# Patient Record
Sex: Male | Born: 1975 | Race: White | Hispanic: No | Smoking: Current every day smoker
Health system: Southern US, Community
[De-identification: ages and names within clinical notes are randomized; demographics above are authoritative.]

## PROBLEM LIST (undated history)

## (undated) DIAGNOSIS — M1990 Unspecified osteoarthritis, unspecified site: Secondary | ICD-10-CM

## (undated) DIAGNOSIS — I10 Essential (primary) hypertension: Secondary | ICD-10-CM

## (undated) DIAGNOSIS — F488 Other specified nonpsychotic mental disorders: Secondary | ICD-10-CM

## (undated) DIAGNOSIS — R251 Tremor, unspecified: Principal | ICD-10-CM

## (undated) DIAGNOSIS — E049 Nontoxic goiter, unspecified: Secondary | ICD-10-CM

## (undated) HISTORY — DX: Essential (primary) hypertension: I10

## (undated) HISTORY — DX: Other specified nonpsychotic mental disorders: F48.8

## (undated) HISTORY — DX: Tremor, unspecified: R25.1

---

## 1998-10-02 HISTORY — PX: OTHER SURGICAL HISTORY: SHX169

## 2004-03-29 ENCOUNTER — Ambulatory Visit (HOSPITAL_COMMUNITY): Admission: RE | Admit: 2004-03-29 | Discharge: 2004-03-29 | Payer: Self-pay | Admitting: Orthopaedic Surgery

## 2006-12-04 ENCOUNTER — Ambulatory Visit: Payer: Self-pay | Admitting: Gastroenterology

## 2006-12-17 ENCOUNTER — Ambulatory Visit: Payer: Self-pay | Admitting: Gastroenterology

## 2006-12-17 LAB — CONVERTED CEMR LAB
Ceruloplasmin: 17 mg/dL — ABNORMAL LOW (ref 21–63)
INR: 1 (ref 0.9–2.0)
Iron: 97 ug/dL (ref 42–165)
Tissue Transglutaminase Ab, IgA: <3 units (ref <5)
Transferrin: 290.8 mg/dL (ref >=212.0)

## 2007-01-03 ENCOUNTER — Ambulatory Visit: Payer: Self-pay | Admitting: Gastroenterology

## 2007-01-11 ENCOUNTER — Encounter: Payer: Self-pay | Admitting: Gastroenterology

## 2007-01-11 LAB — CONVERTED CEMR LAB
Creatinine 24 HR UR: 896 mg/24hr (ref 800–2000)
Creatinine, Urine: 64 mg/dL

## 2008-05-08 ENCOUNTER — Ambulatory Visit: Payer: Self-pay | Admitting: Gastroenterology

## 2008-07-14 ENCOUNTER — Encounter: Admission: RE | Admit: 2008-07-14 | Discharge: 2008-07-14 | Payer: Self-pay | Admitting: Gastroenterology

## 2008-07-14 ENCOUNTER — Ambulatory Visit (HOSPITAL_COMMUNITY): Admission: RE | Admit: 2008-07-14 | Discharge: 2008-07-14 | Payer: Self-pay | Admitting: Gastroenterology

## 2011-02-17 NOTE — Assessment & Plan Note (Signed)
[facility]                         GASTROENTEROLOGY OFFICE NOTE   OAKES, MCCREADY                       MRN:          [id number]  DATE:12/04/2006                            DOB:          19-May-1976    REFERRING PHYSICIAN:  Magnus Sinning. Rice, M.D.   REASON FOR REFERRAL:  Dr. Dimple Casey asked me to evaluate Michael Joyce in  consultation regarding elevated liver tests.   HISTORY OF PRESENT ILLNESS:  Michael Joyce is a very pleasant 35 year old  man who has never been told he had liver troubles in the past. He had a  life insurance physical over the winter and was told that his liver  tests were elevated.  He followed up with liver tests by his primary  care physician in early January. These found an AST of 99, an ALT of  242, a GGT of 147, a normal bilirubin and a normal alkaline phosphatase.  Hepatitis panel was done showing he is hepatitis A, B and C negative.  He was referred for this visit.  He says he donated blood approximately  3 or 4 weeks after these liver tests were drawn and he had no trouble in  donating his blood then.  He donates blood very frequently and has never  heard about troubles with elevated liver tests.  Of interest, he has  been on and off antibiotics a lot recently. He had some sinus infections  and was taking a lot of Biaxin over the winter. The last time he took  Biaxin was in early January.  He has been on none since then.   REVIEW OF SYSTEMS:  Notable for a 25 pound weight gain in the past year  and is otherwise essentially normal and is available on his nursing  intake sheet.   PAST MEDICAL HISTORY:  1. Kidney stones 2 years ago.  2. Seasonal allergies.   CURRENT MEDICATIONS:  Multivitamin and an herb for his liver tests that  he just started 2-3 days ago.   ALLERGIES:  No known drug allergies.   SOCIAL HISTORY:  Married with one daughter. Works for [employer]. Lives  with his wife. Smokes 2-5 cigarettes per day. Drinks  alcohol 5 days a  week, approximately 2 drinks per time.   FAMILY HISTORY:  No colon cancer, no liver disease in family.   PHYSICAL EXAMINATION:  VITAL SIGNS: Height 5 feet, 9 inches. Weight 179  pounds. Blood pressure 124/74, pulse 56.  CONSTITUTIONAL: Generally a well-appearing, neurologically alert and  oriented x3.  HEENT:  Extraocular movements are intact. Oropharynx is moist, no  lesions.  NECK: Supple, no lymphadenopathy.  CARDIOVASCULAR: Regular rate and rhythm.  LUNGS: Clear to auscultation bilaterally.  ABDOMEN: Soft, nontender, nondistended, normal bowel sounds.  EXTREMITIES: No lower extremity edema.  SKIN: No rashes or lesions of visible extremities.   ASSESSMENT AND PLAN:  [age]-year-old man with elevated liver tests,  asymptomatic.   First he donated blood about a month after these liver tests were found  to be so elevated and his blood was not rejected, raising the  possibility that his liver tests  have normalized. I will first get a  repeat hepatic profile. If that is still elevated, then will arrange for  him to have blood drawn for ANA, AMA, antismooth muscle antibody,  ceruloplasmin, iron studies and right upper quadrant ultrasound to check  his liver. He may have fatty liver disease. He has certainly gained a  lot of weight in the past year or so. He also has been on and off of  Biaxin a lot and that is well described to cause hepatitis.     Rachael Fee, MD  Electronically Signed    DPJ/MedQ  DD: 12/04/2006  DT: 12/04/2006  Job #: [id number]

## 2012-10-02 DIAGNOSIS — E049 Nontoxic goiter, unspecified: Secondary | ICD-10-CM

## 2012-10-02 HISTORY — DX: Nontoxic goiter, unspecified: E04.9

## 2012-10-14 ENCOUNTER — Other Ambulatory Visit: Payer: Self-pay | Admitting: Family Medicine

## 2012-10-14 DIAGNOSIS — M549 Dorsalgia, unspecified: Secondary | ICD-10-CM

## 2012-10-18 ENCOUNTER — Ambulatory Visit (HOSPITAL_COMMUNITY)
Admission: RE | Admit: 2012-10-18 | Discharge: 2012-10-18 | Disposition: A | Discharge status: Home / Self Care | Payer: 59 | Source: Ambulatory Visit | Attending: Family Medicine | Admitting: Family Medicine

## 2012-10-18 DIAGNOSIS — M5020 Other cervical disc displacement, unspecified cervical region: Secondary | ICD-10-CM | POA: Insufficient documentation

## 2012-10-18 DIAGNOSIS — M542 Cervicalgia: Secondary | ICD-10-CM | POA: Insufficient documentation

## 2012-10-18 DIAGNOSIS — R209 Unspecified disturbances of skin sensation: Secondary | ICD-10-CM | POA: Insufficient documentation

## 2012-10-18 DIAGNOSIS — M5030 Other cervical disc degeneration, unspecified cervical region: Secondary | ICD-10-CM | POA: Insufficient documentation

## 2012-10-18 DIAGNOSIS — M549 Dorsalgia, unspecified: Secondary | ICD-10-CM

## 2012-10-18 DIAGNOSIS — E042 Nontoxic multinodular goiter: Secondary | ICD-10-CM | POA: Insufficient documentation

## 2012-10-28 ENCOUNTER — Encounter (HOSPITAL_COMMUNITY): Payer: Self-pay | Admitting: Pharmacist

## 2012-10-28 ENCOUNTER — Other Ambulatory Visit: Payer: Self-pay | Admitting: Neurosurgery

## 2012-10-30 ENCOUNTER — Encounter (HOSPITAL_COMMUNITY)
Admission: RE | Admit: 2012-10-30 | Discharge: 2012-10-30 | Disposition: A | Discharge status: Home / Self Care | Payer: 59 | Source: Ambulatory Visit | Attending: Neurosurgery | Admitting: Neurosurgery

## 2012-10-30 ENCOUNTER — Encounter (HOSPITAL_COMMUNITY): Payer: Self-pay

## 2012-10-30 HISTORY — DX: Nontoxic goiter, unspecified: E04.9

## 2012-10-30 HISTORY — DX: Unspecified osteoarthritis, unspecified site: M19.90

## 2012-10-30 LAB — BASIC METABOLIC PANEL
CO2: 28 mEq/L (ref 19–32)
Calcium: 10 mg/dL (ref 8.4–10.5)
Creatinine, Ser: 0.74 mg/dL (ref 0.50–1.35)
GFR calc Af Amer: >90 mL/min (ref >90)
GFR calc non Af Amer: >90 mL/min (ref >90)

## 2012-10-30 LAB — URINALYSIS, ROUTINE W REFLEX MICROSCOPIC
Hgb urine dipstick: NEGATIVE
Nitrite: NEGATIVE
Specific Gravity, Urine: 1.008 (ref 1.005–1.030)
pH: 6.5 (ref 5.0–8.0)

## 2012-10-30 LAB — CBC WITH DIFFERENTIAL/PLATELET
Basophils Absolute: 0 10*3/uL (ref 0.0–0.1)
Basophils Relative: 1 % (ref 0–1)
HCT: 47 % (ref 39.0–52.0)
Hemoglobin: 16.2 g/dL (ref 13.0–17.0)
Lymphocytes Relative: 35 % (ref 12–46)
MCH: 34 pg (ref 26.0–34.0)
MCHC: 34.5 g/dL (ref 30.0–36.0)
MCV: 98.5 fL (ref 78.0–100.0)
Monocytes Relative: 8 % (ref 3–12)
RBC: 4.77 MIL/uL (ref 4.22–5.81)
RDW: 12.2 % (ref 11.5–15.5)
WBC: 6 10*3/uL (ref 4.0–10.5)

## 2012-10-30 LAB — PROTIME-INR: Prothrombin Time: 12.4 seconds (ref 11.6–15.2)

## 2012-10-30 LAB — APTT: aPTT: 29 seconds (ref 24–37)

## 2012-10-30 MED ORDER — CEFAZOLIN SODIUM-DEXTROSE 2-3 GM-% IV SOLR
2.0000 g | INTRAVENOUS | Status: AC
  Administered 2012-10-31: 2 g via INTRAVENOUS
  Filled 2012-10-30: qty 50

## 2012-10-30 NOTE — Progress Notes (Signed)
Pt here for PAT.  Sleep study/ Apnea: Denies ECHO- 2000; Denies any cardiac problems since then. Stress: 2000; Denies any cardiac problems since then. Heart Ablation: 2000-done at [facility], [place].  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  Jaynie Collins, PA, notified and given case to review.  Pt reports having blood work last week at family MD: Dr. Wardell Heath, [facility] [phone]). Attempted to call practice- recording stated office closed due to weather.

## 2012-10-30 NOTE — Pre-Procedure Instructions (Signed)
Michael Joyce  10/30/2012   Your procedure is scheduled on: Thursday, 10/31/2012@7 :30AM.  Report to [facility] at 5:30 AM.  Call this number if you have problems the morning of surgery: [phone]   Remember:   Do not eat food or drink liquids after midnight.   Take these medicines the morning of surgery with A SIP OF WATER: Hydrocodone-Acetaminophen or Flexeril  (if needed)   Do not wear jewelry, make-up or nail polish.  Do not wear lotions, powders, or perfumes. You may wear deodorant.  Do not shave 48 hours prior to surgery. Men may shave face and neck.  Do not bring valuables to the hospital.  Contacts, dentures or bridgework may not be worn into surgery.  Leave suitcase in the car. After surgery it may be brought to your room.  For patients admitted to the hospital, checkout time is 11:00 AM the day of discharge.   Patients discharged the day of surgery will not be allowed to drive home.  Name and phone number of your driver:   Special Instructions: Shower using CHG 2 nights before surgery and the night before surgery.  If you shower the day of surgery use CHG.  Use special wash - you have one bottle of CHG for all showers.  You should use approximately 1/3 of the bottle for each shower.   Please read over the following fact sheets that you were given: Pain Booklet, Coughing and Deep Breathing and Surgical Site Infection Prevention

## 2012-10-30 NOTE — Consult Note (Signed)
Anesthesia Note:  Patient is a 37 year old male scheduled for C6-7 ACDF by Dr. Phoebe Perch on 10/31/12.  History includes WPW s/p ablation ~ 2000 at [facility] in [place], [place].  He is no longer followed by a cardiologist, and he denies any cardiac symptoms since his ablation.  PCP is Dr. Margo Aye with [facility].  He reports he is currently being evaluated for a thyroid goiter with plans for ultrasound in the near future, but reports his thyroid lab tests have been WNL.  EKG on 10/30/12 showed NSR with sinus arrhythmia.  Labs are pending.  I reviewed WPW, ablation history with Anesthesiologist Dr. Michelle Piper who agrees that if patient was not having any recurrent symptoms or worrisome EKG changes that Anesthesia would not require any preoperative cardiology evaluation.  Shonna Chock, PA-C 10/30/12 1628

## 2012-10-31 ENCOUNTER — Ambulatory Visit (HOSPITAL_COMMUNITY): Payer: 59 | Admitting: Vascular Surgery

## 2012-10-31 ENCOUNTER — Ambulatory Visit (HOSPITAL_COMMUNITY)
Admission: RE | Admit: 2012-10-31 | Discharge: 2012-10-31 | Disposition: A | Discharge status: Home / Self Care | Payer: 59 | Source: Ambulatory Visit | Attending: Neurosurgery | Admitting: Neurosurgery

## 2012-10-31 ENCOUNTER — Encounter (HOSPITAL_COMMUNITY)
Admission: RE | Disposition: A | Discharge status: Home / Self Care | Payer: Self-pay | Source: Ambulatory Visit | Attending: Neurosurgery

## 2012-10-31 ENCOUNTER — Encounter (HOSPITAL_COMMUNITY): Payer: Self-pay | Admitting: Vascular Surgery

## 2012-10-31 ENCOUNTER — Ambulatory Visit (HOSPITAL_COMMUNITY): Payer: 59

## 2012-10-31 ENCOUNTER — Encounter (HOSPITAL_COMMUNITY): Payer: Self-pay | Admitting: *Deleted

## 2012-10-31 DIAGNOSIS — M4712 Other spondylosis with myelopathy, cervical region: Secondary | ICD-10-CM | POA: Insufficient documentation

## 2012-10-31 DIAGNOSIS — Z01812 Encounter for preprocedural laboratory examination: Secondary | ICD-10-CM | POA: Insufficient documentation

## 2012-10-31 DIAGNOSIS — Z01810 Encounter for preprocedural cardiovascular examination: Secondary | ICD-10-CM | POA: Insufficient documentation

## 2012-10-31 DIAGNOSIS — M5000 Cervical disc disorder with myelopathy, unspecified cervical region: Secondary | ICD-10-CM | POA: Insufficient documentation

## 2012-10-31 DIAGNOSIS — F17200 Nicotine dependence, unspecified, uncomplicated: Secondary | ICD-10-CM | POA: Insufficient documentation

## 2012-10-31 HISTORY — PX: ANTERIOR CERVICAL DECOMP/DISCECTOMY FUSION: SHX1161

## 2012-10-31 SURGERY — ANTERIOR CERVICAL DECOMPRESSION/DISCECTOMY FUSION 1 LEVEL
Anesthesia: General | Site: Neck | Wound class: Clean

## 2012-10-31 MED ORDER — ARTIFICIAL TEARS OP OINT
TOPICAL_OINTMENT | OPHTHALMIC | Status: DC | PRN
  Administered 2012-10-31: 1 via OPHTHALMIC

## 2012-10-31 MED ORDER — PHENOL 1.4 % MT LIQD: 1.0000 | OROMUCOSAL | Status: DC | PRN

## 2012-10-31 MED ORDER — ZOLPIDEM TARTRATE 5 MG PO TABS: 5.0000 mg | ORAL_TABLET | Freq: Every evening | ORAL | Status: DC | PRN

## 2012-10-31 MED ORDER — MAGNESIUM HYDROXIDE 400 MG/5ML PO SUSP: 30.0000 mL | Freq: Every day | ORAL | Status: DC | PRN

## 2012-10-31 MED ORDER — FENTANYL CITRATE 0.05 MG/ML IJ SOLN
INTRAMUSCULAR | Status: DC | PRN
  Administered 2012-10-31 (×2): 50 ug via INTRAVENOUS
  Administered 2012-10-31: 100 ug via INTRAVENOUS
  Administered 2012-10-31: 25 ug via INTRAVENOUS
  Administered 2012-10-31: 100 ug via INTRAVENOUS
  Administered 2012-10-31: 50 ug via INTRAVENOUS

## 2012-10-31 MED ORDER — GLYCOPYRROLATE 0.2 MG/ML IJ SOLN
INTRAMUSCULAR | Status: DC | PRN
  Administered 2012-10-31: 0.4 mg via INTRAVENOUS

## 2012-10-31 MED ORDER — LIDOCAINE-EPINEPHRINE 1 %-1:100000 IJ SOLN
INTRAMUSCULAR | Status: DC | PRN
  Administered 2012-10-31: 10 mL

## 2012-10-31 MED ORDER — METHOCARBAMOL 500 MG PO TABS
500.0000 mg | ORAL_TABLET | Freq: Four times a day (QID) | ORAL | Status: DC | PRN
  Filled 2012-10-31: qty 1

## 2012-10-31 MED ORDER — ROCURONIUM BROMIDE 100 MG/10ML IV SOLN
INTRAVENOUS | Status: DC | PRN
  Administered 2012-10-31: 50 mg via INTRAVENOUS

## 2012-10-31 MED ORDER — HYDROCODONE-ACETAMINOPHEN 5-325 MG PO TABS: 1.0000 | ORAL_TABLET | ORAL | Status: DC | PRN

## 2012-10-31 MED ORDER — ACETAMINOPHEN 325 MG PO TABS: 650.0000 mg | ORAL_TABLET | ORAL | Status: DC | PRN

## 2012-10-31 MED ORDER — ONDANSETRON HCL 4 MG/2ML IJ SOLN
INTRAMUSCULAR | Status: DC | PRN
  Administered 2012-10-31 (×2): 4 mg via INTRAVENOUS

## 2012-10-31 MED ORDER — NEOSTIGMINE METHYLSULFATE 1 MG/ML IJ SOLN
INTRAMUSCULAR | Status: DC | PRN
  Administered 2012-10-31: 3 mg via INTRAVENOUS

## 2012-10-31 MED ORDER — PROMETHAZINE HCL 25 MG/ML IJ SOLN: 12.5000 mg | INTRAMUSCULAR | Status: DC | PRN

## 2012-10-31 MED ORDER — BACITRACIN 50000 UNITS IM SOLR
INTRAMUSCULAR | Status: AC
  Filled 2012-10-31: qty 1

## 2012-10-31 MED ORDER — MIDAZOLAM HCL 5 MG/5ML IJ SOLN
INTRAMUSCULAR | Status: DC | PRN
  Administered 2012-10-31: 2 mg via INTRAVENOUS

## 2012-10-31 MED ORDER — SODIUM CHLORIDE 0.9 % IJ SOLN: 3.0000 mL | INTRAMUSCULAR | Status: DC | PRN

## 2012-10-31 MED ORDER — OXYCODONE HCL 5 MG/5ML PO SOLN: 5.0000 mg | Freq: Once | ORAL | Status: AC | PRN

## 2012-10-31 MED ORDER — LIDOCAINE HCL (CARDIAC) 20 MG/ML IV SOLN
INTRAVENOUS | Status: DC | PRN
  Administered 2012-10-31: 80 mg via INTRAVENOUS

## 2012-10-31 MED ORDER — METHOCARBAMOL 100 MG/ML IJ SOLN
500.0000 mg | Freq: Four times a day (QID) | INTRAVENOUS | Status: DC | PRN
  Filled 2012-10-31: qty 5

## 2012-10-31 MED ORDER — HYDROMORPHONE HCL PF 1 MG/ML IJ SOLN
0.2500 mg | INTRAMUSCULAR | Status: DC | PRN
  Administered 2012-10-31 (×2): 0.5 mg via INTRAVENOUS

## 2012-10-31 MED ORDER — METHOCARBAMOL 500 MG PO TABS: 500.0000 mg | ORAL_TABLET | Freq: Four times a day (QID) | ORAL | Status: DC | PRN

## 2012-10-31 MED ORDER — PROPOFOL 10 MG/ML IV BOLUS
INTRAVENOUS | Status: DC | PRN
  Administered 2012-10-31: 200 mg via INTRAVENOUS

## 2012-10-31 MED ORDER — CEFAZOLIN SODIUM 1-5 GM-% IV SOLN
1.0000 g | Freq: Three times a day (TID) | INTRAVENOUS | Status: DC
  Administered 2012-10-31: 1 g via INTRAVENOUS
  Filled 2012-10-31 (×2): qty 50

## 2012-10-31 MED ORDER — SODIUM CHLORIDE 0.9 % IR SOLN
Status: DC | PRN
  Administered 2012-10-31: 08:00:00

## 2012-10-31 MED ORDER — KETOROLAC TROMETHAMINE 30 MG/ML IJ SOLN
30.0000 mg | Freq: Four times a day (QID) | INTRAMUSCULAR | Status: DC
  Administered 2012-10-31: 30 mg via INTRAVENOUS

## 2012-10-31 MED ORDER — CYCLOBENZAPRINE HCL 10 MG PO TABS: 10.0000 mg | ORAL_TABLET | Freq: Three times a day (TID) | ORAL | Status: DC | PRN

## 2012-10-31 MED ORDER — MORPHINE SULFATE 2 MG/ML IJ SOLN: 1.0000 mg | INTRAMUSCULAR | Status: DC | PRN

## 2012-10-31 MED ORDER — PROMETHAZINE HCL 25 MG PO TABS: 12.5000 mg | ORAL_TABLET | ORAL | Status: DC | PRN

## 2012-10-31 MED ORDER — LACTATED RINGERS IV SOLN
INTRAVENOUS | Status: DC | PRN
  Administered 2012-10-31 (×2): via INTRAVENOUS

## 2012-10-31 MED ORDER — POLYVINYL ALCOHOL 1.4 % OP SOLN: 1.0000 [drp] | Freq: Every day | OPHTHALMIC | Status: DC | PRN

## 2012-10-31 MED ORDER — MENTHOL 3 MG MT LOZG: 1.0000 | LOZENGE | OROMUCOSAL | Status: DC | PRN

## 2012-10-31 MED ORDER — LIDOCAINE HCL 4 % MT SOLN
OROMUCOSAL | Status: DC | PRN
  Administered 2012-10-31: 4 mL via TOPICAL

## 2012-10-31 MED ORDER — DEXAMETHASONE SODIUM PHOSPHATE 4 MG/ML IJ SOLN
INTRAMUSCULAR | Status: DC | PRN
  Administered 2012-10-31: 8 mg via INTRAVENOUS

## 2012-10-31 MED ORDER — HYDROMORPHONE HCL PF 1 MG/ML IJ SOLN
INTRAMUSCULAR | Status: AC
  Filled 2012-10-31: qty 1

## 2012-10-31 MED ORDER — THROMBIN 5000 UNITS EX KIT
PACK | CUTANEOUS | Status: DC | PRN
  Administered 2012-10-31 (×2): 5000 [IU] via TOPICAL

## 2012-10-31 MED ORDER — METHOCARBAMOL 100 MG/ML IJ SOLN
500.0000 mg | Freq: Four times a day (QID) | INTRAVENOUS | Status: DC | PRN
  Administered 2012-10-31: 500 mg via INTRAVENOUS
  Filled 2012-10-31: qty 5

## 2012-10-31 MED ORDER — DOCUSATE SODIUM 100 MG PO CAPS: 100.0000 mg | ORAL_CAPSULE | Freq: Two times a day (BID) | ORAL | Status: DC

## 2012-10-31 MED ORDER — 0.9 % SODIUM CHLORIDE (POUR BTL) OPTIME
TOPICAL | Status: DC | PRN
  Administered 2012-10-31: 1000 mL

## 2012-10-31 MED ORDER — ACETAMINOPHEN 650 MG RE SUPP: 650.0000 mg | RECTAL | Status: DC | PRN

## 2012-10-31 MED ORDER — SODIUM CHLORIDE 0.9 % IJ SOLN
3.0000 mL | Freq: Two times a day (BID) | INTRAMUSCULAR | Status: DC
  Administered 2012-10-31: 3 mL via INTRAVENOUS

## 2012-10-31 MED ORDER — SODIUM CHLORIDE 0.9 % IV SOLN
INTRAVENOUS | Status: AC
  Filled 2012-10-31: qty 500

## 2012-10-31 MED ORDER — CARBOXYMETHYLCELLULOSE SODIUM 1 % OP SOLN: 1.0000 [drp] | Freq: Every day | OPHTHALMIC | Status: DC | PRN

## 2012-10-31 MED ORDER — OXYCODONE-ACETAMINOPHEN 5-325 MG PO TABS
1.0000 | ORAL_TABLET | ORAL | Status: DC | PRN
  Administered 2012-10-31: 1 via ORAL
  Filled 2012-10-31: qty 1

## 2012-10-31 MED ORDER — OXYCODONE HCL 5 MG PO TABS
5.0000 mg | ORAL_TABLET | Freq: Once | ORAL | Status: AC | PRN
  Administered 2012-10-31: 5 mg via ORAL

## 2012-10-31 MED ORDER — OXYCODONE HCL 5 MG PO TABS
ORAL_TABLET | ORAL | Status: AC
  Filled 2012-10-31: qty 1

## 2012-10-31 MED ORDER — HEMOSTATIC AGENTS (NO CHARGE) OPTIME
TOPICAL | Status: DC | PRN
  Administered 2012-10-31: 1 via TOPICAL

## 2012-10-31 MED ORDER — ONDANSETRON HCL 4 MG/2ML IJ SOLN: 4.0000 mg | INTRAMUSCULAR | Status: DC | PRN

## 2012-10-31 MED ORDER — ONDANSETRON HCL 4 MG/2ML IJ SOLN: 4.0000 mg | Freq: Four times a day (QID) | INTRAMUSCULAR | Status: DC | PRN

## 2012-10-31 MED ORDER — LACTATED RINGERS IV SOLN: INTRAVENOUS | Status: DC

## 2012-10-31 SURGICAL SUPPLY — 58 items
APL SKNCLS STERI-STRIP NONHPOA (GAUZE/BANDAGES/DRESSINGS) ×1
BAG DECANTER FOR FLEXI CONT (MISCELLANEOUS) ×2 IMPLANT
BANDAGE GAUZE ELAST BULKY 4 IN (GAUZE/BANDAGES/DRESSINGS) ×4 IMPLANT
BENZOIN TINCTURE PRP APPL 2/3 (GAUZE/BANDAGES/DRESSINGS) ×2 IMPLANT
BIT DRILL 14MM (INSTRUMENTS) IMPLANT
BONE CERV LORDOTIC 14.5X12X7 (Bone Implant) IMPLANT
BONE CERV LORDOTIC 14.5X12X9 (Bone Implant) ×2 IMPLANT
BUR MATCHSTICK NEURO 3.0 LAGG (BURR) ×2 IMPLANT
CANISTER SUCTION 2500CC (MISCELLANEOUS) ×2 IMPLANT
CLOTH BEACON ORANGE TIMEOUT ST (SAFETY) ×2 IMPLANT
CONT SPEC 4OZ CLIKSEAL STRL BL (MISCELLANEOUS) ×2 IMPLANT
DRAPE LAPAROTOMY 100X72 PEDS (DRAPES) ×2 IMPLANT
DRAPE MICROSCOPE LEICA (MISCELLANEOUS) ×2 IMPLANT
DRAPE POUCH INSTRU U-SHP 10X18 (DRAPES) ×2 IMPLANT
DRILL 14MM (INSTRUMENTS) ×2
DURAPREP 6ML APPLICATOR 50/CS (WOUND CARE) ×2 IMPLANT
ELECT REM PT RETURN 9FT ADLT (ELECTROSURGICAL) ×2
ELECTRODE REM PT RTRN 9FT ADLT (ELECTROSURGICAL) ×1 IMPLANT
GAUZE SPONGE 4X4 16PLY XRAY LF (GAUZE/BANDAGES/DRESSINGS) IMPLANT
GLOVE BIO SURGEON STRL SZ8.5 (GLOVE) ×1 IMPLANT
GLOVE ECLIPSE 7.5 STRL STRAW (GLOVE) ×2 IMPLANT
GLOVE EXAM NITRILE LRG STRL (GLOVE) IMPLANT
GLOVE EXAM NITRILE MD LF STRL (GLOVE) IMPLANT
GLOVE EXAM NITRILE XL STR (GLOVE) IMPLANT
GLOVE EXAM NITRILE XS STR PU (GLOVE) IMPLANT
GLOVE INDICATOR 7.0 STRL GRN (GLOVE) ×1 IMPLANT
GLOVE SS BIOGEL STRL SZ 8 (GLOVE) IMPLANT
GLOVE SUPERSENSE BIOGEL SZ 8 (GLOVE) ×1
GLOVE SURG SS PI 7.0 STRL IVOR (GLOVE) ×2 IMPLANT
GOWN BRE IMP SLV AUR LG STRL (GOWN DISPOSABLE) ×1 IMPLANT
GOWN BRE IMP SLV AUR XL STRL (GOWN DISPOSABLE) ×2 IMPLANT
GOWN STRL REIN 2XL LVL4 (GOWN DISPOSABLE) IMPLANT
GRAFT BNE SPCR VG2 14.5X12X7 (Bone Implant) IMPLANT
GRAFT BNE SPCR VG2 14.5X12X9 (Bone Implant) IMPLANT
HEAD HALTER (SOFTGOODS) ×2 IMPLANT
KIT BASIN OR (CUSTOM PROCEDURE TRAY) ×2 IMPLANT
KIT ROOM TURNOVER OR (KITS) ×2 IMPLANT
NDL HYPO 25X1 1.5 SAFETY (NEEDLE) ×1 IMPLANT
NDL SPNL 20GX3.5 QUINCKE YW (NEEDLE) ×1 IMPLANT
NEEDLE HYPO 22GX1.5 SAFETY (NEEDLE) ×1 IMPLANT
NEEDLE HYPO 25X1 1.5 SAFETY (NEEDLE) ×2 IMPLANT
NEEDLE SPNL 20GX3.5 QUINCKE YW (NEEDLE) ×2 IMPLANT
NS IRRIG 1000ML POUR BTL (IV SOLUTION) ×2 IMPLANT
PACK LAMINECTOMY NEURO (CUSTOM PROCEDURE TRAY) ×2 IMPLANT
PAD ARMBOARD 7.5X6 YLW CONV (MISCELLANEOUS) ×6 IMPLANT
PATTIES SURGICAL .75X.75 (GAUZE/BANDAGES/DRESSINGS) ×2 IMPLANT
PIN DISTRACTION 14MM (PIN) ×4 IMPLANT
PLATE 14MM (Plate) ×1 IMPLANT
RUBBERBAND STERILE (MISCELLANEOUS) ×4 IMPLANT
SCREW 14MM (Screw) ×4 IMPLANT
SPONGE GAUZE 4X4 12PLY (GAUZE/BANDAGES/DRESSINGS) ×1 IMPLANT
SPONGE INTESTINAL PEANUT (DISPOSABLE) ×2 IMPLANT
STRIP CLOSURE SKIN 1/2X4 (GAUZE/BANDAGES/DRESSINGS) ×2 IMPLANT
SUT VIC AB 3-0 SH 8-18 (SUTURE) ×3 IMPLANT
SYR 20ML ECCENTRIC (SYRINGE) ×2 IMPLANT
TOWEL OR 17X24 6PK STRL BLUE (TOWEL DISPOSABLE) ×2 IMPLANT
TOWEL OR 17X26 10 PK STRL BLUE (TOWEL DISPOSABLE) ×2 IMPLANT
WATER STERILE IRR 1000ML POUR (IV SOLUTION) ×2 IMPLANT

## 2012-10-31 NOTE — H&P (Signed)
See H& P.

## 2012-10-31 NOTE — Anesthesia Preprocedure Evaluation (Signed)
Anesthesia Evaluation  Patient identified by MRN, date of birth, ID band Patient awake    Reviewed: Allergy & Precautions, H&P , NPO status , Patient's Chart, lab work & pertinent test results  Airway Mallampati: II  Neck ROM: full    Dental   Pulmonary Current Smoker,          Cardiovascular  S/p ablation   Neuro/Psych    GI/Hepatic   Endo/Other    Renal/GU      Musculoskeletal  (+) Arthritis -,   Abdominal   Peds  Hematology   Anesthesia Other Findings   Reproductive/Obstetrics                           Anesthesia Physical Anesthesia Plan  ASA: II  Anesthesia Plan: General   Post-op Pain Management:    Induction: Intravenous  Airway Management Planned: Oral ETT  Additional Equipment:   Intra-op Plan:   Post-operative Plan: Extubation in OR  Informed Consent: I have reviewed the patients History and Physical, chart, labs and discussed the procedure including the risks, benefits and alternatives for the proposed anesthesia with the patient or authorized representative who has indicated his/her understanding and acceptance.     Plan Discussed with: CRNA and Surgeon  Anesthesia Plan Comments:         Anesthesia Quick Evaluation

## 2012-10-31 NOTE — Interval H&P Note (Signed)
History and Physical Interval Note:  10/31/2012 7:26 AM  Michael Joyce  has presented today for surgery, with the diagnosis of Cervical hnp with myelopathy, Cervical spondylosis with myelopathy  The various methods of treatment have been discussed with the patient and family. After consideration of risks, benefits and other options for treatment, the patient has consented to  Procedure(s) (LRB) with comments: ANTERIOR CERVICAL DECOMPRESSION/DISCECTOMY FUSION 1 LEVEL (N/A) - C6-7 Anterior cervical decompression/diskectomy/fusion/LifeNet bone/Trestle plate as a surgical intervention .  The patient's history has been reviewed, patient examined, no change in status, stable for surgery.  I have reviewed the patient's chart and labs.  Questions were answered to the patient's satisfaction.     [name]

## 2012-10-31 NOTE — Anesthesia Procedure Notes (Signed)
Procedure Name: Intubation Date/Time: 10/31/2012 7:37 AM Performed by: Margaree Mackintosh Pre-anesthesia Checklist: Patient identified, Timeout performed, Emergency Drugs available, Suction available and Patient being monitored Patient Re-evaluated:Patient Re-evaluated prior to inductionOxygen Delivery Method: Circle system utilized Preoxygenation: Pre-oxygenation with 100% oxygen Intubation Type: IV induction Ventilation: Mask ventilation without difficulty and Oral airway inserted - appropriate to patient size Laryngoscope Size: Mac and 3 Grade View: Grade I Tube type: Oral Tube size: 7.5 mm Number of attempts: 1 Airway Equipment and Method: Stylet and LTA kit utilized Placement Confirmation: ETT inserted through vocal cords under direct vision,  breath sounds checked- equal and bilateral and positive ETCO2 Secured at: 22 cm Tube secured with: Tape Dental Injury: Teeth and Oropharynx as per pre-operative assessment

## 2012-10-31 NOTE — Discharge Summary (Signed)
Physician Discharge Summary  Patient ID: Michael Joyce MRN: [id number] DOB/AGE: 1975/10/04 36 y.o.  Admit date: 10/31/2012 Discharge date: 10/31/2012  Admission Diagnoses:Cervical herniated nucleus pulposus with myelopathy, Cervical spondylosis with myelopathy Left C6-7   Discharge Diagnoses: Cervical herniated nucleus pulposus with myelopathy, Cervical spondylosis with myelopathy Left C6-7  Active Problems:  * No active hospital problems. *    Discharged Condition: good  Hospital Course: pt admitted on day of surgery - underwent procedure below - pt doing well - ambulating, voiding, taking PO   Significant Diagnostic Studies: none  Treatments: surgery: ANTERIOR CERVICAL DECOMPRESSION/DISCECTOMY FUSION 1 LEVEL, C6-7, lifnet bone, trestle plate   Discharge Exam: Blood pressure 121/84, pulse 73, temperature 98.5 F (36.9 C), temperature source Oral, resp. rate 18, SpO2 98.00%. Wound:c/d/i  Disposition: home     Medication List     As of 10/31/2012  9:34 AM    TAKE these medications         carboxymethylcellulose 1 % ophthalmic solution   Apply 1 drop to eye daily as needed. For dry eyes      cyclobenzaprine 10 MG tablet   Commonly known as: FLEXERIL   Take 10 mg by mouth daily at 6 PM. For muscle spasms      HYDROcodone-acetaminophen 5-325 MG per tablet   Commonly known as: NORCO/VICODIN   Take 1 tablet by mouth every 8 (eight) hours as needed. For pain         Signed: Clydene Fake, MD 10/31/2012, 9:34 AM

## 2012-10-31 NOTE — Op Note (Signed)
10/31/2012  9:26 AM  PATIENT:  Michael Joyce  37 y.o. male  PRE-OPERATIVE DIAGNOSIS:  Cervical herniated nucleus pulposus with myelopathy, Cervical spondylosis with myelopathy Left C6-7  POST-OPERATIVE DIAGNOSIS: same  PROCEDURE:  Procedure(s): ANTERIOR CERVICAL DECOMPRESSION/DISCECTOMY FUSION 1 LEVEL, C6-7, lifnet bone, trestle plate  SURGEON:  Surgeon(s): Clydene Fake, MD  ASSISTANTS:jenkins  ANESTHESIA:   general  EBL:  Total I/O In: 1000 [I.V.:1000] Out: 150 [Blood:150]  BLOOD ADMINISTERED:none  DRAINS: none   SPECIMEN:  No Specimen  DICTATION: Patient with neck and left arm pain and numbness since September but last 5 weeks has been severe with weakness in left triceps positive Spurling. MRI was done showing disc herniation left-sided C6 and patient brought in for one level ACF  Patient brought into operating room general anesthesia induced patient placed in 10 pounds halter traction prepped draped sterile fashion 7injected with 10 cc 1% lites with epinephrine incision was made from the midline to the left side to the intervertebral sternocleidomastoid muscle incision taken down to the fascia hemostasis obtained with Bovie cauterization platysma was incised the Bovie and blunt dissection taken to the anterior cervical fascia the anterior cervical spine. needle was placed in interspace x-rays was obtained from the needle was at the 67 disc space do space was incised as was removed and partial discectomy done pituitary rongeurs.  Self-retaining retractors were placed distraction pins placed the C6 and C7 interspace distracted microscope brought in for microdissection discectomy continued with curettes pituitary rongeurs and 1 and 2 mm Kerrison punches were used to remove posterior disc osteophyte and ligament decompressed this central canal and bilateral foramina. A fragment of disc removed and the foramen decompressing the nerve root there. It hemostasis with Gelfoam thrombin  this was irrigated out. High-speed drill to remove templated measured the space to be 7 mm a 7 mm LifeNet allograft bone was tapped in place. The distraction distraction pins removed hemostasis obtained with (weight was removed the traction was good firm position a Tressel anterior cervical plate posterior and anterior cervical spine 2 screws placed in a seated 62 C7 these were tightened down lateral x-rays attention good position plate-screw bone plug at the C6-7 level. Retractors removed we did hemostasis we are solution platysma closed through Vicryl interrupted sutures subcutaneous tissue closed the same skin closed benzoin Steri-Strips dressing was placed patient saucer color woken from anesthesia and transferred to recovery room  PLAN OF CARE: Admit for overnight observation  PATIENT DISPOSITION:  PACU - hemodynamically stable.

## 2012-10-31 NOTE — Transfer of Care (Signed)
Immediate Anesthesia Transfer of Care Note  Patient: Michael Joyce  Procedure(s) Performed: Procedure(s) (LRB) with comments: ANTERIOR CERVICAL DECOMPRESSION/DISCECTOMY FUSION 1 LEVEL (N/A) - Cervical six-seven  Anterior cervical decompression/diskectomy/fusion/LifeNet bone/Trestle plate  Patient Location: PACU  Anesthesia Type:General  Level of Consciousness: awake, alert  and oriented  Airway & Oxygen Therapy: Patient Spontanous Breathing and Patient connected to nasal cannula oxygen  Post-op Assessment: Report given to PACU RN, Post -op Vital signs reviewed and stable and Patient moving all extremities X 4  Post vital signs: Reviewed and stable  Complications: No apparent anesthesia complications

## 2012-10-31 NOTE — Anesthesia Postprocedure Evaluation (Signed)
Anesthesia Post Note  Patient: Michael Joyce  Procedure(s) Performed: Procedure(s) (LRB): ANTERIOR CERVICAL DECOMPRESSION/DISCECTOMY FUSION 1 LEVEL (N/A)  Anesthesia type: General  Patient location: PACU  Post pain: Pain level controlled and Adequate analgesia  Post assessment: Post-op Vital signs reviewed, Patient's Cardiovascular Status Stable, Respiratory Function Stable, Patent Airway and Pain level controlled  Last Vitals:  Filed Vitals:   10/31/12 1030  BP: 131/86  Pulse:   Temp: 37.1 C  Resp:     Post vital signs: Reviewed and stable  Level of consciousness: awake, alert  and oriented  Complications: No apparent anesthesia complications

## 2012-10-31 NOTE — Plan of Care (Signed)
Problem: Consults Goal: Diagnosis - Spinal Surgery Outcome: Completed/Met Date Met:  10/31/12 Cervical Spine Fusion

## 2012-10-31 NOTE — Preoperative (Signed)
Beta Blockers   Reason not to administer Beta Blockers:Not Applicable 

## 2012-11-01 ENCOUNTER — Encounter (HOSPITAL_COMMUNITY): Payer: Self-pay | Admitting: Neurosurgery

## 2012-12-18 ENCOUNTER — Other Ambulatory Visit (HOSPITAL_BASED_OUTPATIENT_CLINIC_OR_DEPARTMENT_OTHER): Payer: Self-pay | Admitting: Family Medicine

## 2012-12-18 DIAGNOSIS — E042 Nontoxic multinodular goiter: Secondary | ICD-10-CM

## 2012-12-24 ENCOUNTER — Other Ambulatory Visit (HOSPITAL_BASED_OUTPATIENT_CLINIC_OR_DEPARTMENT_OTHER): Payer: 59

## 2012-12-24 ENCOUNTER — Ambulatory Visit (HOSPITAL_BASED_OUTPATIENT_CLINIC_OR_DEPARTMENT_OTHER)
Admission: RE | Admit: 2012-12-24 | Discharge: 2012-12-24 | Disposition: A | Discharge status: Home / Self Care | Payer: 59 | Source: Ambulatory Visit | Attending: Family Medicine | Admitting: Family Medicine

## 2012-12-24 DIAGNOSIS — E042 Nontoxic multinodular goiter: Secondary | ICD-10-CM

## 2012-12-24 DIAGNOSIS — E041 Nontoxic single thyroid nodule: Secondary | ICD-10-CM | POA: Insufficient documentation

## 2013-07-07 ENCOUNTER — Other Ambulatory Visit: Payer: Self-pay | Admitting: Internal Medicine

## 2013-07-07 DIAGNOSIS — E042 Nontoxic multinodular goiter: Secondary | ICD-10-CM

## 2013-07-09 ENCOUNTER — Ambulatory Visit
Admission: RE | Admit: 2013-07-09 | Discharge: 2013-07-09 | Disposition: A | Discharge status: Home / Self Care | Payer: 59 | Source: Ambulatory Visit | Attending: Internal Medicine | Admitting: Internal Medicine

## 2013-07-09 DIAGNOSIS — E042 Nontoxic multinodular goiter: Secondary | ICD-10-CM

## 2013-07-14 ENCOUNTER — Other Ambulatory Visit: Payer: Self-pay | Admitting: Internal Medicine

## 2013-07-14 DIAGNOSIS — E041 Nontoxic single thyroid nodule: Secondary | ICD-10-CM

## 2013-07-16 ENCOUNTER — Ambulatory Visit
Admission: RE | Admit: 2013-07-16 | Discharge: 2013-07-16 | Disposition: A | Discharge status: Home / Self Care | Payer: 59 | Source: Ambulatory Visit | Attending: Internal Medicine | Admitting: Internal Medicine

## 2013-07-16 ENCOUNTER — Other Ambulatory Visit: Payer: Self-pay | Admitting: Internal Medicine

## 2013-07-16 DIAGNOSIS — E041 Nontoxic single thyroid nodule: Secondary | ICD-10-CM

## 2013-07-21 ENCOUNTER — Encounter: Payer: Self-pay | Admitting: Neurology

## 2013-07-22 ENCOUNTER — Encounter: Payer: Self-pay | Admitting: Neurology

## 2013-07-22 ENCOUNTER — Ambulatory Visit (INDEPENDENT_AMBULATORY_CARE_PROVIDER_SITE_OTHER): Payer: 59 | Admitting: Neurology

## 2013-07-22 ENCOUNTER — Encounter (INDEPENDENT_AMBULATORY_CARE_PROVIDER_SITE_OTHER): Payer: Self-pay

## 2013-07-22 VITALS — BP 126/80 | HR 66 | Ht 68.0 in | Wt 175.0 lb

## 2013-07-22 DIAGNOSIS — R259 Unspecified abnormal involuntary movements: Secondary | ICD-10-CM

## 2013-07-22 DIAGNOSIS — F488 Other specified nonpsychotic mental disorders: Secondary | ICD-10-CM | POA: Insufficient documentation

## 2013-07-22 DIAGNOSIS — F458 Other somatoform disorders: Secondary | ICD-10-CM

## 2013-07-22 DIAGNOSIS — R251 Tremor, unspecified: Secondary | ICD-10-CM

## 2013-07-22 HISTORY — DX: Other specified nonpsychotic mental disorders: F48.8

## 2013-07-22 HISTORY — DX: Tremor, unspecified: R25.1

## 2013-07-22 NOTE — Progress Notes (Signed)
Reason for visit: Tremor  Michael Joyce is a 37 y.o. male  History of present illness:  Michael Joyce is a 37 year old left-handed white male with a history of cervical spine disease requiring surgery in January 2014. By July 2014, the patient noted onset of a fine action tremor affecting the right upper extremity. The patient has not noted any left arm tremor. The patient at the same time began noting cramps in his right hand with specific activities. The patient can bring on a cramp by reaching into his pocket to pickup his keys, or with using a steak knife to cut a steak. The patient otherwise can use his hands normally. The patient denies any numbness or focal weakness of the arms or legs, but he does note some generalized sensation of fatigue. The patient denies any balance problems or problems controlling the bowels or the bladder. The patient has noted no numbness of the face, arms, or legs. The patient comes to this office for an evaluation of the tremor and the muscle cramps of the hand.  Past Medical History  Diagnosis Date  . Arthritis     neck  . Goiter 2014  . Hypertension   . Tremor 07/22/2013  . 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 Heart ablation  2000  . Anterior cervical decomp/discectomy fusion  10/31/2012    Procedure: ANTERIOR CERVICAL DECOMPRESSION/DISCECTOMY FUSION 1 LEVEL;  Surgeon: Clydene Fake, MD;  Location: [facility];  Service: Neurosurgery;  Laterality: N/A;  Cervical six-seven  Anterior cervical decompression/diskectomy/fusion/LifeNet bone/Trestle plate    Family History  Problem Relation Age of Onset  . Heart attack Father     Social history:  reports that he has been smoking Cigarettes.  He has been smoking about 0.25 packs per day. He has never used smokeless tobacco. He reports that he drinks about 1.8 ounces of alcohol per week. His drug history is not on file.  Medications:  Current  Outpatient Prescriptions on File Prior to Visit  Medication Sig Dispense Refill  . carboxymethylcellulose 1 % ophthalmic solution Apply 1 drop to eye daily as needed. For dry eyes      . cyclobenzaprine (FLEXERIL) 10 MG tablet Take 10 mg by mouth daily at 6 PM. For muscle spasms       No current facility-administered medications on file prior to visit.     No Known Allergies  ROS:  Out of a complete 14 system review of symptoms, the patient complains only of the following symptoms, and all other reviewed systems are negative.  Fatigue Snoring Confusion, weakness, tremor Anxiety, not enough sleep, decreased energy Sleepiness, restless legs.  Blood pressure 126/80, pulse 66, height 5\' 8"  (1.727 m), weight 175 lb (79.379 kg).  Physical Exam  General: The patient is alert and cooperative at the time of the examination.  Head: Pupils are equal, round, and reactive to light. Discs are flat bilaterally.  Neck: The neck is supple, no carotid bruits are noted.  Respiratory: The respiratory examination is clear.  Cardiovascular: The cardiovascular examination reveals a regular rate and rhythm, no obvious murmurs or rubs are noted.  Skin: Extremities are without significant edema.  Neurologic Exam  Mental status: The patient is alert and oriented x 3.  Cranial nerves: Facial symmetry is present. There is good sensation of the face to pinprick and soft touch bilaterally. The strength of the facial muscles and the muscles  to head turning and shoulder shrug are normal bilaterally. Speech is well enunciated, no aphasia or dysarthria is noted. Extraocular movements are full. Visual fields are full.  Motor: The motor testing reveals 5 over 5 strength of all 4 extremities. Good symmetric motor tone is noted throughout.  Sensory: Sensory testing is intact to pinprick, soft touch, vibration sensation, and position sense on all 4 extremities. No evidence of extinction is  noted.  Coordination: Cerebellar testing reveals good finger-nose-finger and heel-to-shin bilaterally.  Gait and station: Gait is normal. Tandem gait is normal. Romberg is negative. No drift is seen. With the arms outstretched, the patient has a very fine action type tremor involving the right upper extremity.  Reflexes: Deep tendon reflexes are symmetric and normal bilaterally. Toes are downgoing bilaterally.   Assessment/Plan:  1. Writer's cramp, right hand  2. Action tremor, right hand  The patient does have an isolated tremor that is a very fine intention or action tremor involving the right arm. The patient indicates that this began recently, in July 2014. At the same time, he developed what sounds like a writer's cramp which is a focal dystonia that is task specific. The patient will be sent for MRI evaluation of the brain to exclude the possibility of demyelinating disease. The patient indicates that he did have a recent thyroid study. The patient will be referred to Dr. Hosie Poisson for Botox injections for the writer's cramp.  Marlan Palau MD 07/22/2013 7:35 PM  [facility] [address] [place], [place] [address]  Phone [phone] Fax [phone]

## 2013-07-29 ENCOUNTER — Institutional Professional Consult (permissible substitution): Payer: 59 | Admitting: Neurology

## 2013-07-31 ENCOUNTER — Ambulatory Visit (INDEPENDENT_AMBULATORY_CARE_PROVIDER_SITE_OTHER): Payer: 59

## 2013-07-31 ENCOUNTER — Encounter: Payer: Self-pay | Admitting: Neurology

## 2013-07-31 ENCOUNTER — Ambulatory Visit (INDEPENDENT_AMBULATORY_CARE_PROVIDER_SITE_OTHER): Payer: 59 | Admitting: Neurology

## 2013-07-31 VITALS — BP 122/83 | HR 59 | Ht 70.0 in | Wt 176.0 lb

## 2013-07-31 DIAGNOSIS — R251 Tremor, unspecified: Secondary | ICD-10-CM

## 2013-07-31 DIAGNOSIS — R259 Unspecified abnormal involuntary movements: Secondary | ICD-10-CM

## 2013-07-31 DIAGNOSIS — G249 Dystonia, unspecified: Secondary | ICD-10-CM

## 2013-07-31 DIAGNOSIS — G2589 Other specified extrapyramidal and movement disorders: Secondary | ICD-10-CM

## 2013-07-31 DIAGNOSIS — F488 Other specified nonpsychotic mental disorders: Secondary | ICD-10-CM

## 2013-07-31 DIAGNOSIS — R5381 Other malaise: Secondary | ICD-10-CM

## 2013-07-31 DIAGNOSIS — F458 Other somatoform disorders: Secondary | ICD-10-CM

## 2013-07-31 NOTE — Patient Instructions (Signed)
Overall you are doing fairly well but I do want to suggest a few things today:   Your hand cramping is most consistent with a diagnosis of focal hand dystonia. This may respond well to Botox therapy.  We will check some lab work today to check for causes of your fatigue.   We will contact you to schedule a follow up for your injections. Please call us with any interim questions, concerns, problems, updates or refill requests.   Please also call us for any test results so we can go over those with you on the phone.  My clinical assistant and will answer any of your questions and relay your messages to me and also relay most of my messages to you.   Our phone number is [phone]. We also have an after hours call service for urgent matters and there is a physician on-call for urgent questions. For any emergencies you know to call 911 or go to the nearest emergency room

## 2013-07-31 NOTE — Progress Notes (Signed)
[facility]    Provider:  Dr Hosie Poisson Referring Provider: York Spaniel, MD Primary Care Physician:  Michael Joyce  CC:  Task specific dystonia  HPI:  Michael Joyce is a 37 y.o. male here as a referral from Michael. Anne Joyce for evaluation of focal hand dystonia and hand tremor.   Describes hand movement as an  Involuntary flexion of the fingers and thumb, fingers flexed typically at the PIP more so than the DIP. This typically occurs with specific past such as cutting steak, reaching to his doctor for his keys. It is associated with a painful cramping sensation. Cramping is located in the palm and forearm. When this occurs he needs to stretch his hands and wait until they relax. Otherwise no difficulty with using his hands. Also notes a mild intention action tremor of his right hand. This comes and goes throughout the day. He also notes excessive fatigue lack of energy lack of interest, decreased sexual drive. He has known thyroid nodules, then of a right endocrinology and schedule for possible biopsy which was canceled due to lack of need per the patient  prior visit 07/2013 with Michael Joyce: 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 By July 2014, the patient noted onset of a fine action tremor affecting the right upper extremity. The patient has not noted any left arm tremor. The patient at the same time began noting cramps in his right hand with specific activities. The patient can bring on a cramp by reaching into his pocket to pickup his keys, or with using a steak knife to cut a steak. The patient otherwise can use his hands normally. The patient denies any numbness or focal weakness of the arms or legs, but he does note some generalized sensation of fatigue. The patient denies any balance problems or problems controlling the bowels or the bladder. The patient has noted no numbness of the  face, arms, or legs. The patient comes to this office for an evaluation of the tremor and the muscle cramps of the hand.  Notes worsening atigue as the day goes on, tremor worsens as the day goes. Stress/anxiety worsens the tremor. Has zero interest in having sex with his girlfriend, has been onset in the past few months.   Review of Systems: Out of a complete 14 system review, the patient complains of only the following symptoms, and all other reviewed systems are negative. Positive for weight gain blurred vision joint pain cramps  History   Social History  . Marital Status: Divorced    Spouse Name: N/A    Number of Children: 1  . Years of Education: COLLEGE   Occupational History  .  [employer]   Social History Main Topics  . Smoking status: Current Every Day Smoker -- 0.25 packs/day    Types: Cigarettes  . Smokeless tobacco: Never Used  . Alcohol Use: 1.8 oz/week    3 Cans of beer per week     Comment: SOCIALLY  . Drug Use: Not on file  . Sexual Activity: Not on file   Other Topics Concern  . Not on file   Social History Narrative   Patient lives at home alone.    Patient works for [employer].    Patient has his BA.    Patient has 1 child.     Family History  Problem Relation Age of Onset  . Heart attack Father  Past Medical History  Diagnosis Date  . Arthritis     neck  . Goiter 2014  . Hypertension   . Tremor 07/22/2013  . 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 Heart ablation  2000  . Anterior cervical decomp/discectomy fusion  10/31/2012    Procedure: ANTERIOR CERVICAL DECOMPRESSION/DISCECTOMY FUSION 1 LEVEL;  Surgeon: Clydene Fake, MD;  Location: [facility];  Service: Neurosurgery;  Laterality: N/A;  Cervical six-seven  Anterior cervical decompression/diskectomy/fusion/LifeNet bone/Trestle plate    Current Outpatient Prescriptions  Medication Sig Dispense Refill  . cyclobenzaprine  (FLEXERIL) 10 MG tablet Take 10 mg by mouth daily at 6 PM. For muscle spasms      . levothyroxine (SYNTHROID, LEVOTHROID) 50 MCG tablet Take 50 mcg by mouth daily before breakfast.      . metoprolol succinate (TOPROL-XL) 50 MG 24 hr tablet Take 50 mg by mouth daily.       No current facility-administered medications for this visit.    Allergies as of 07/31/2013  . (No Known Allergies)    Vitals: BP 122/83  Pulse 59  Ht 5\' 10"  (1.778 m)  Wt 176 lb (79.833 kg)  BMI 25.25 kg/m2 Last Weight:  Wt Readings from Last 1 Encounters:  07/31/13 176 lb (79.833 kg)   Last Height:   Ht Readings from Last 1 Encounters:  07/31/13 5\' 10"  (1.778 m)     General: The patient is alert and cooperative at the time of the examination.  Head: Pupils are equal, round, and reactive to light. Discs are flat bilaterally.  Neck: The neck is supple, no carotid bruits are noted.  Respiratory: The respiratory examination is clear.  Cardiovascular: The cardiovascular examination reveals a regular rate and rhythm, no obvious murmurs or rubs are noted.  Skin: Extremities are without significant edema.  Neurologic Exam  Mental status: The patient is alert and oriented x 3.  Cranial nerves: Facial symmetry is present. There is good sensation of the face to pinprick and soft touch bilaterally. The strength of the facial muscles and the muscles to head turning and shoulder shrug are normal bilaterally. Speech is well enunciated, no aphasia or dysarthria is noted. Extraocular movements are full. Visual fields are full.  Motor: The motor testing reveals 5 over 5 strength of all 4 extremities. Good symmetric motor tone is noted throughout.  Sensory: Sensory testing is intact to pinprick, soft touch, vibration sensation, and position sense on all 4 extremities. No evidence of extinction is noted.  Coordination: Cerebellar testing reveals good finger-nose-finger and heel-to-shin bilaterally. Fine tremor of right hand when  outstretched, no intention tremor. Noted contraction/flexion of right fingers when attempting to mimic cutting a steak using a knife in right hand. During finger flexion patient noted pain in palm and tightness in forearm. Gait and station: Gait is normal. Tandem gait is normal. Romberg is negative. No drift is seen. With the arms outstretched, the patient has a very fine action type tremor involving the right upper extremity.  Reflexes: Deep tendon reflexes are symmetric and normal bilaterally. Toes are downgoing bilaterally.     Assessment:  After physical and neurologic examination, review of laboratory studies, imaging, neurophysiology testing and pre-existing records, assessment will be reviewed on the problem list.  Plan:  Treatment plan and additional workup will be reviewed under Problem List.  1)Focal hand dystonia 2)Tremor 3)Fatigue  37y/o with involuntary contraction of his fingers and painful cramping  associated with specific past. Patient also notes fine tremor the right hand and denies fatigue. 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 Pending MRI results will place request for Xeomin 50 units to treat task specific dystonia. Check blood work for possible cause of fatigue.

## 2013-08-01 ENCOUNTER — Telehealth: Payer: Self-pay | Admitting: Neurology

## 2013-08-01 MED ORDER — GADOPENTETATE DIMEGLUMINE 469.01 MG/ML IV SOLN: 16.0000 mL | Freq: Once | INTRAVENOUS | Status: AC | PRN

## 2013-08-01 NOTE — Telephone Encounter (Signed)
I called patient. MRI the brain was normal. The patient has seen Dr. Hosie Poisson, and botulism toxin injections will be planned for the future.

## 2013-08-03 LAB — VITAMIN B12: Vitamin B-12: 410 pg/mL (ref 211–946)

## 2013-08-03 LAB — TESTOSTERONE, FREE, TOTAL, SHBG
Testosterone, Free: 16 pg/mL (ref 8.7–25.1)
Testosterone, total: 605.8 ng/dL (ref 348.0–1197.0)

## 2013-08-03 LAB — VITAMIN D 25 HYDROXY (VIT D DEFICIENCY, FRACTURES): Vit D, 25-Hydroxy: 31 ng/mL (ref 30.0–100.0)

## 2013-08-05 NOTE — Progress Notes (Signed)
Quick Note:  Shared normal lab findings per Dr Minus Breeding findings, patient understood and requested a mailed copy. Mailed copy...08/04/13 ______

## 2014-04-07 IMAGING — US US SOFT TISSUE HEAD/NECK
1 series · 14 of 25 positions shown · non-contrast
Comparison: Ultrasound of the thyroid of 12/24/2012

CLINICAL DATA: Thyroid goiter, follow

EXAM:
THYROID ULTRASOUND
TECHNIQUE: Ultrasound examination of the thyroid gland and adjacent soft
tissues was performed.

[Series 1: us soft tissue head/neck · 0.10mm/px · 14 of 82 slices shown]
[im 1/82]
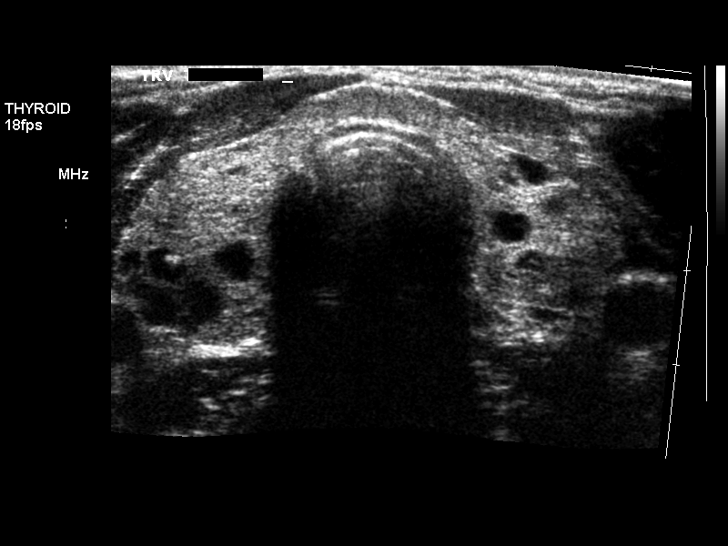
[im 7/82]
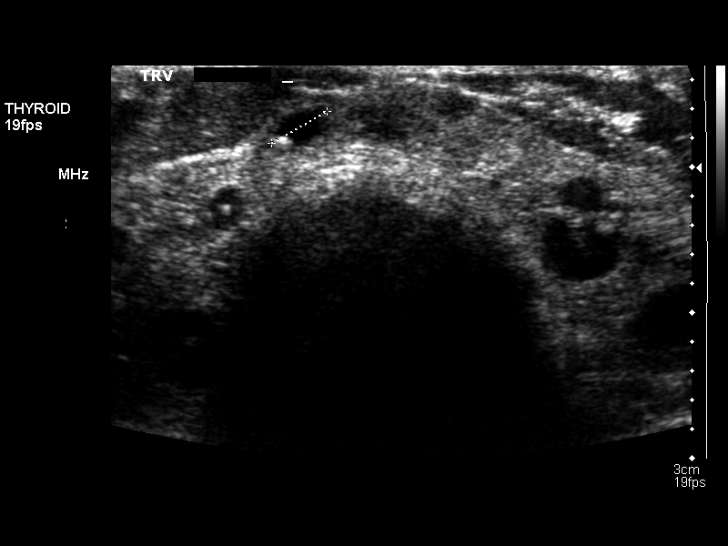
[im 14/82]
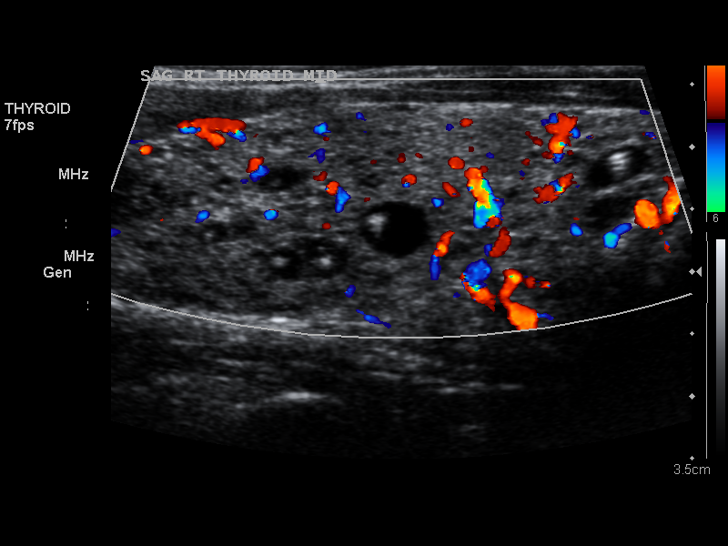
[im 21/82]
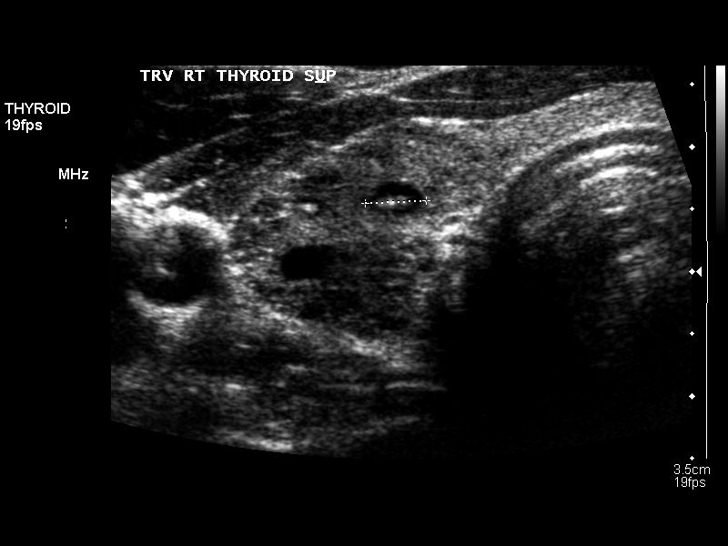
[im 28/82]
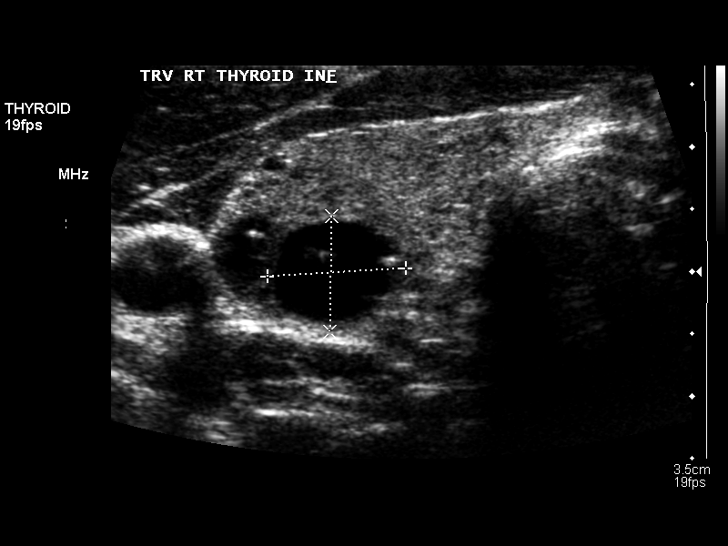
[im 31/82]
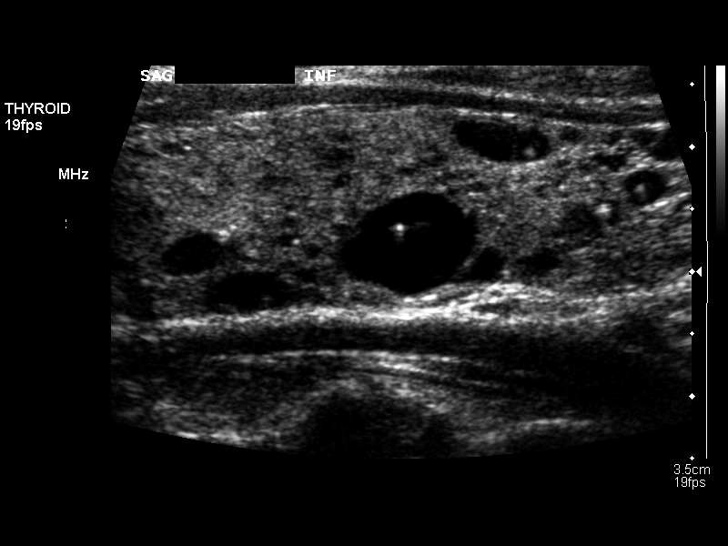
[im 38/82]
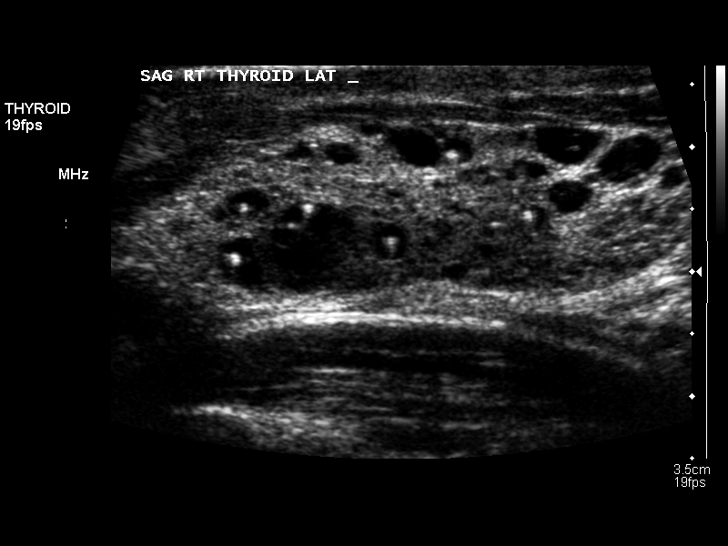
[im 44/82]
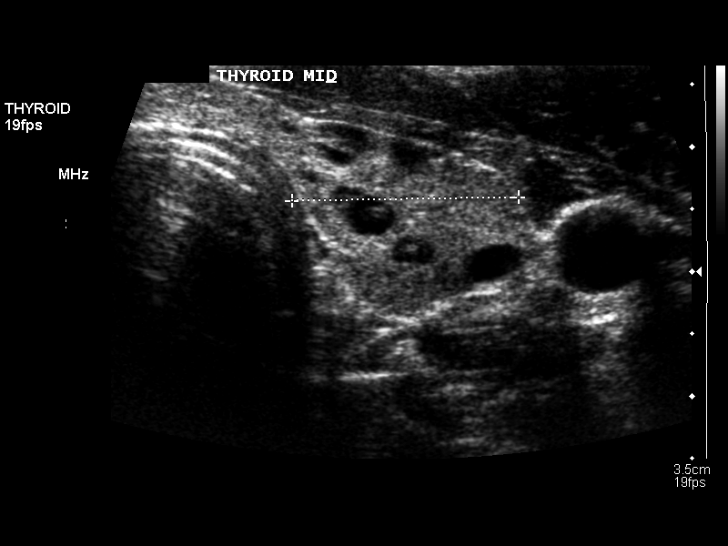
[im 51/82]
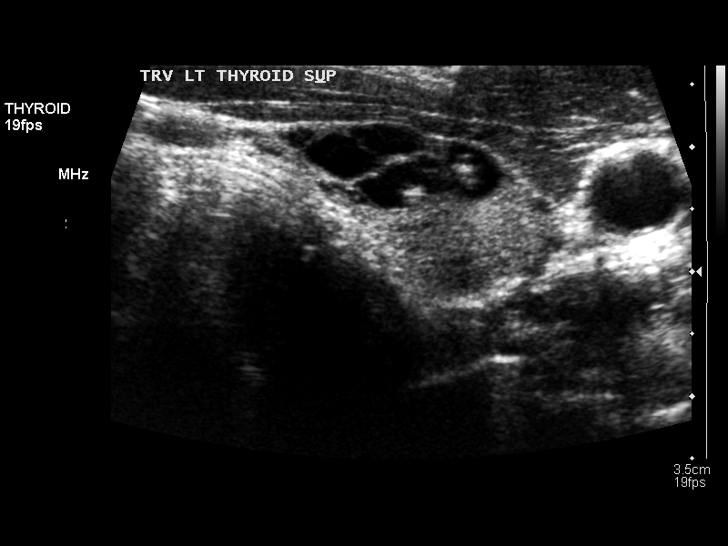
[im 55/82]
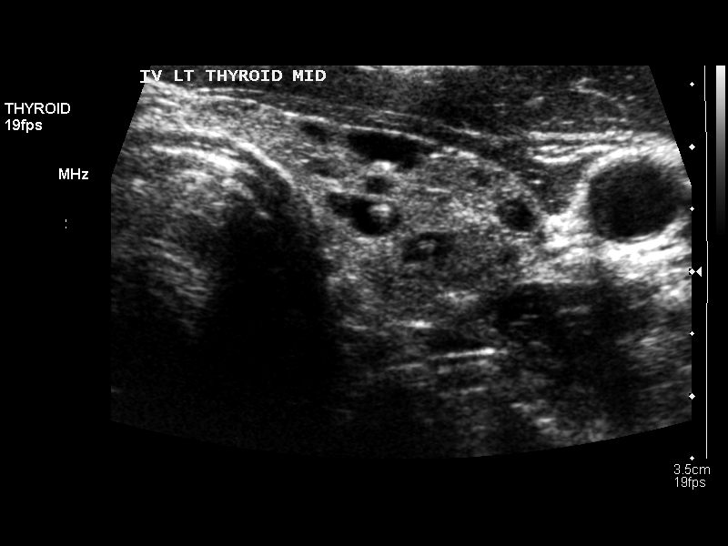
[im 61/82]
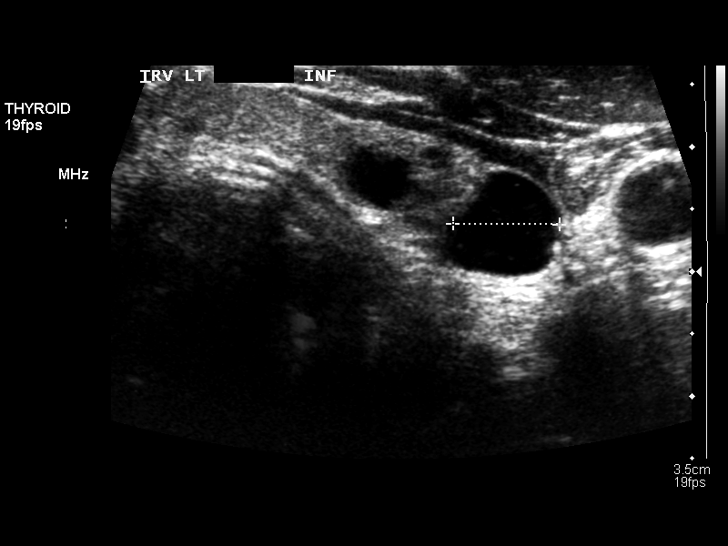
[im 68/82]
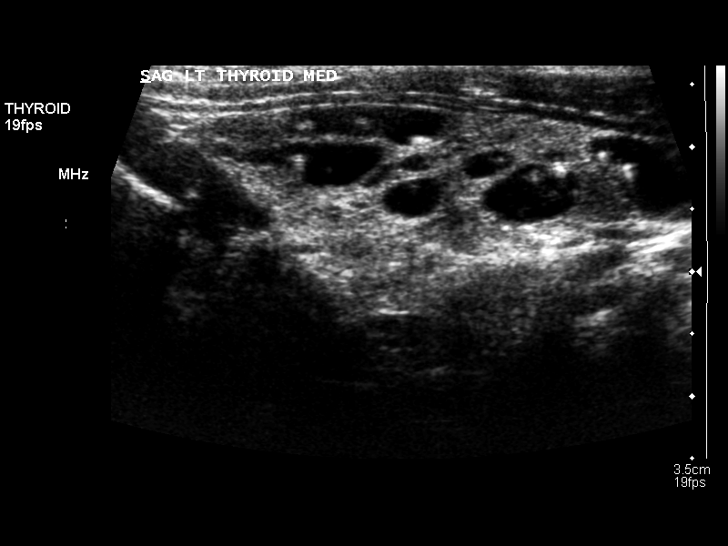
[im 75/82]
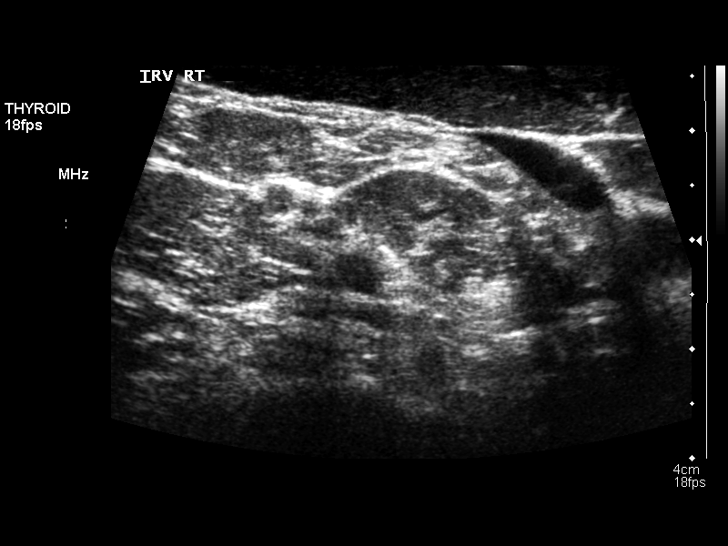
[im 82/82]
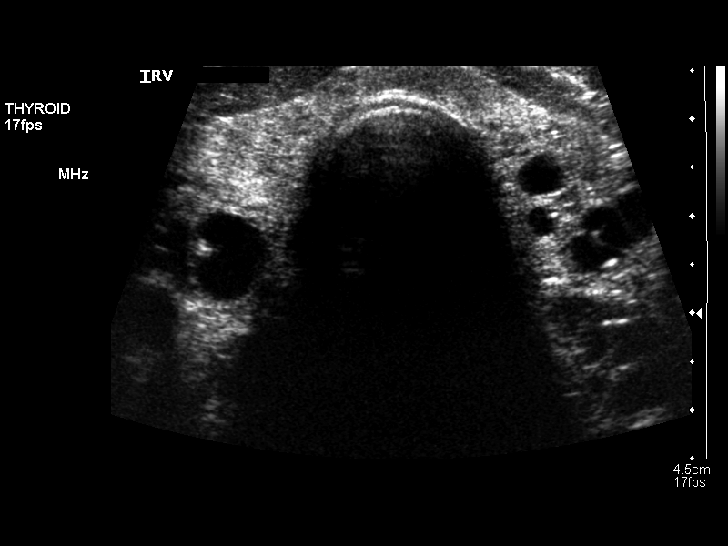

[14 of 25 positions shown; findings below may reference images not displayed]

FINDINGS: Right thyroid lobe

Measurements: 6.1 x 1.8 x 2.8 cm. (Previously 6.0 x 1.7 x 1.9 cm)..
Multiple hypoechoic nodules are scattered throughout the right lobe.
The largest is in the lower pole measuring 1.1 x 0.9 x 1.1 cm. No
enlarging nodule is seen.

Left thyroid lobe

Measurements: 5.2 x 1.3 x 1.8 cm. (Previously 5.2 x 1.5 x 1.7 cm)..
Multiple primarily hypoechoic nodules are scattered throughout the
left lobe. The largest is in the upper pole measuring 1.6 x 0.7 x
2.0 cm compared to prior measurements of 1.5 x 0.6 x 1.8 cm. No
enlarging nodule is seen.

Isthmus

Thickness: 5.6 mm in thickness.. Multiple small hypoechoic nodules
are present within the isthmus as well.

Lymphadenopathy

None visualized.
IMPRESSION: Stable multinodular goiter with the nodules primarily hypoechoic
scattered throughout both lobes of thyroid. No enlarging nodule is
seen.

## 2014-05-08 ENCOUNTER — Ambulatory Visit: Payer: 59 | Admitting: Neurology

## 2014-05-08 ENCOUNTER — Emergency Department (HOSPITAL_COMMUNITY)
Admission: EM | Admit: 2014-05-08 | Discharge: 2014-05-09 | Disposition: A | Discharge status: Home / Self Care | Payer: 59 | Attending: Emergency Medicine | Admitting: Emergency Medicine

## 2014-05-08 ENCOUNTER — Encounter (HOSPITAL_COMMUNITY): Payer: Self-pay | Admitting: Emergency Medicine

## 2014-05-08 ENCOUNTER — Emergency Department (HOSPITAL_COMMUNITY): Payer: 59

## 2014-05-08 DIAGNOSIS — R946 Abnormal results of thyroid function studies: Secondary | ICD-10-CM | POA: Insufficient documentation

## 2014-05-08 DIAGNOSIS — R079 Chest pain, unspecified: Secondary | ICD-10-CM | POA: Diagnosis present

## 2014-05-08 DIAGNOSIS — I10 Essential (primary) hypertension: Secondary | ICD-10-CM | POA: Diagnosis not present

## 2014-05-08 DIAGNOSIS — R0789 Other chest pain: Secondary | ICD-10-CM | POA: Diagnosis not present

## 2014-05-08 DIAGNOSIS — Z862 Personal history of diseases of the blood and blood-forming organs and certain disorders involving the immune mechanism: Secondary | ICD-10-CM | POA: Insufficient documentation

## 2014-05-08 DIAGNOSIS — Z8659 Personal history of other mental and behavioral disorders: Secondary | ICD-10-CM | POA: Insufficient documentation

## 2014-05-08 DIAGNOSIS — Z8739 Personal history of other diseases of the musculoskeletal system and connective tissue: Secondary | ICD-10-CM | POA: Diagnosis not present

## 2014-05-08 DIAGNOSIS — Z79899 Other long term (current) drug therapy: Secondary | ICD-10-CM | POA: Diagnosis not present

## 2014-05-08 DIAGNOSIS — R519 Headache, unspecified: Secondary | ICD-10-CM

## 2014-05-08 DIAGNOSIS — R7989 Other specified abnormal findings of blood chemistry: Secondary | ICD-10-CM

## 2014-05-08 DIAGNOSIS — F17200 Nicotine dependence, unspecified, uncomplicated: Secondary | ICD-10-CM | POA: Insufficient documentation

## 2014-05-08 DIAGNOSIS — R51 Headache: Secondary | ICD-10-CM | POA: Insufficient documentation

## 2014-05-08 DIAGNOSIS — Z8639 Personal history of other endocrine, nutritional and metabolic disease: Secondary | ICD-10-CM | POA: Insufficient documentation

## 2014-05-08 LAB — BASIC METABOLIC PANEL
Anion gap: 16 — ABNORMAL HIGH (ref 5–15)
BUN: 13 mg/dL (ref 6–23)
CALCIUM: 9.8 mg/dL (ref 8.4–10.5)
CO2: 26 mEq/L (ref 19–32)
Chloride: 97 mEq/L (ref 96–112)
Creatinine, Ser: 0.79 mg/dL (ref 0.50–1.35)
GFR calc Af Amer: >90 mL/min (ref >90)
Glucose, Bld: 90 mg/dL (ref 70–99)
Potassium: 3.7 mEq/L (ref 3.7–5.3)
SODIUM: 139 meq/L (ref 137–147)

## 2014-05-08 LAB — I-STAT TROPONIN, ED: TROPONIN I, POC: 0 ng/mL (ref 0.00–0.08)

## 2014-05-08 LAB — CBC
HCT: 43.8 % (ref 39.0–52.0)
Hemoglobin: 15.6 g/dL (ref 13.0–17.0)
MCH: 35.6 pg — ABNORMAL HIGH (ref 26.0–34.0)
MCHC: 35.6 g/dL (ref 30.0–36.0)
MCV: 100 fL (ref 78.0–100.0)
Platelets: 189 10*3/uL (ref 150–400)
RBC: 4.38 MIL/uL (ref 4.22–5.81)
RDW: 12.3 % (ref 11.5–15.5)
WBC: 7 10*3/uL (ref 4.0–10.5)

## 2014-05-08 NOTE — ED Provider Notes (Signed)
CSN: [id number]     Arrival date & time 05/08/14  2247 History   First MD Initiated Contact with Patient 05/08/14 2307     Chief Complaint  Patient presents with  . Chest Pain     (Consider location/radiation/quality/duration/timing/severity/associated sxs/prior Treatment) HPI Patient has a previous history of WPW status post ablation and hypertension. He takes metoprolol daily. He states this evening around 7 PM he had left-sided chest pain associated with throbbing sensation in bilateral ears. He states he took his blood pressure and it was 170ss over 110's. Denies ingesting any stimulants. The chest pain is since resolved. Had no associated shortness of breath. No nausea or vomiting. The patient states that he is being worked up for persistent left-sided headache and facial numbness for the past month. He was supposed to see the neurologist today and was rescheduled. He also complains of new onset in the last month left elbow arthralgia. He's had no extended travel or surgeries. He denies any lower extremity swelling or pain. He said periodic low-grade fever and chills. Patient has no known personal or family history form from embolic disease. States his father had a MI in his late 64s. States for all symptoms began last month he noticed a tick that was unattached on him. States it was a wood tick not a deer tick.  Past Medical History  Diagnosis Date  . Arthritis     neck  . Goiter 2014  . Hypertension   . Tremor 07/22/2013  . 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 Heart ablation  2000  . Anterior cervical decomp/discectomy fusion  10/31/2012    Procedure: ANTERIOR CERVICAL DECOMPRESSION/DISCECTOMY FUSION 1 LEVEL;  Surgeon: Otilio Connors, MD;  Location: [facility];  Service: Neurosurgery;  Laterality: N/A;  Cervical six-seven  Anterior cervical decompression/diskectomy/fusion/LifeNet bone/Trestle plate   Family History  Problem  Relation Age of Onset  . Heart attack Father    History  Substance Use Topics  . Smoking status: Current Every Day Smoker -- 0.25 packs/day    Types: Cigarettes  . Smokeless tobacco: Never Used  . Alcohol Use: 1.8 oz/week    3 Cans of beer per week     Comment: SOCIALLY    Review of Systems  Constitutional: Positive for fever and fatigue. Negative for chills.  HENT: Negative for congestion, rhinorrhea, sinus pressure, sore throat and trouble swallowing.   Eyes: Negative for visual disturbance.  Respiratory: Negative for cough and shortness of breath.   Cardiovascular: Positive for chest pain. Negative for palpitations and leg swelling.  Gastrointestinal: Negative for nausea, vomiting, abdominal pain and diarrhea.  Musculoskeletal: Positive for arthralgias and neck pain. Negative for back pain, myalgias and neck stiffness.  Skin: Negative for rash and wound.  Neurological: Positive for headaches. Negative for dizziness, weakness, light-headedness and numbness.  All other systems reviewed and are negative.     Allergies  Simvastatin  Home Medications   Prior to Admission medications   Medication Sig Start Date End Date Taking? Authorizing Provider  metoprolol succinate (TOPROL-XL) 50 MG 24 hr tablet Take 50 mg by mouth daily. 05/30/13   Historical Provider, MD   BP 181/103  Pulse 90  Temp(Src) 99.2 F (37.3 C) (Oral)  Resp 20  Ht 5' 8"  (1.727 m)  Wt 175 lb (79.379 kg)  BMI 26.61 kg/m2  SpO2 99% Physical Exam  Nursing note and vitals reviewed. Constitutional: He is oriented to  person, place, and time. He appears well-developed and well-nourished. No distress.  HENT:  Head: Normocephalic and atraumatic.  Mouth/Throat: Oropharynx is clear and moist.  No sinus tenderness to percussion.  Eyes: EOM are normal. Pupils are equal, round, and reactive to light.  Neck: Normal range of motion. Neck supple.  Left-sided thyroid mass. Nontender. Mobile. No meningismus   Cardiovascular: Normal rate and regular rhythm.  Exam reveals no gallop and no friction rub.   No murmur heard. Pulmonary/Chest: Effort normal and breath sounds normal. No respiratory distress. He has no wheezes. He has no rales. He exhibits no tenderness.  Abdominal: Soft. Bowel sounds are normal. He exhibits no distension and no mass. There is no tenderness. There is no rebound and no guarding.  Musculoskeletal: Normal range of motion. He exhibits no edema and no tenderness.  No calf swelling or tenderness. The thoracic lumbar tenderness.  Lymphadenopathy:    He has no cervical adenopathy.  Neurological: He is alert and oriented to person, place, and time.  Patient is alert and oriented x3 with clear, goal oriented speech. Patient has 5/5 motor in all extremities. Sensation is intact to light touch. Bilateral finger-to-nose is normal with no signs of dysmetria. Patient has a normal gait and walks without assistance.   Skin: Skin is warm and dry. No rash noted. No erythema.  Psychiatric: His behavior is normal.  Mildly anxious    ED Course  Procedures (including critical care time) Labs Review Labs Reviewed  CBC - Abnormal; Notable for the following:    MCH 35.6 (*)    All other components within normal limits  BASIC METABOLIC PANEL - Abnormal; Notable for the following:    Anion gap 16 (*)    All other components within normal limits  D-DIMER, QUANTITATIVE  ROCKY MTN SPOTTED FVR AB, IGG-BLOOD  ROCKY MTN SPOTTED FVR AB, IGM-BLOOD  B. BURGDORFI ANTIBODIES  TSH  T4, FREE  I-STAT TROPOININ, ED    Imaging Review Dg Chest Port 1 View  05/08/2014   CLINICAL DATA:  Chest pain.  Shortness of breath.  EXAM: PORTABLE CHEST - 1 VIEW  COMPARISON:  None.  FINDINGS: The lungs are well-aerated and clear. There is no evidence of focal opacification, pleural effusion or pneumothorax.  The cardiomediastinal silhouette is within normal limits. No acute osseous abnormalities are seen. Cervical  spinal fusion hardware is noted.  IMPRESSION: No acute cardiopulmonary process seen.   Electronically Signed   By: Garald Balding M.D.   On: 05/08/2014 23:45     EKG Interpretation   Date/Time:  Friday May 08 2014 22:49:34 EDT Ventricular Rate:  78 PR Interval:  154 QRS Duration: 80 QT Interval:  348 QTC Calculation: 396 R Axis:   -7 Text Interpretation:  Sinus rhythm with marked sinus arrhythmia  Nonspecific T wave abnormality Abnormal ECG Confirmed by Lita Mains  MD,  Dalyn Kjos ([id number]) on 05/08/2014 11:08:22 PM      MDM   Final diagnoses:  None    Patient with a normal EKG and troponins x2. He has a normal d-dimer. His chest x-ray without any acute findings. Presentation is atypical for coronary artery disease. However given family history he's been advised to followup with a cardiologist for further testing.  Patient also has multiple other symptoms that may be related to atypical exposure. Lyme disease and [facility] spotted fever titers were drawn. Will start on doxy and have the patient followup with his primary Dr. in the next one or 2 days. He's been  given strict return precautions and is voiced understanding.  Patient has an appointment to see his endocrinologist this month.     Julianne Rice, MD 05/09/14 [phone]

## 2014-05-08 NOTE — ED Notes (Addendum)
Pt reports L ear ache on July 5th was told possible sinus infection. Everyday since then sx have gotten worse and now has numbness from top of head to L neck. Pt with hx of cervical fusion and was supposed to follow up neurologist today but they canceled his apt. Pt reports this evening he started having non radiating mid sternal cp with numbness to L fingers. Pt also sts his blood pressure has been more elevated. Denies weakness or dizziness.

## 2014-05-08 NOTE — ED Notes (Signed)
Pt sts he wants to be checked for lyme disease- he pulled tic off of himself 3 weeks ago. No rashes noted.

## 2014-05-09 LAB — T4, FREE: FREE T4: 1.56 ng/dL (ref 0.80–1.80)

## 2014-05-09 LAB — I-STAT TROPONIN, ED: TROPONIN I, POC: 0 ng/mL (ref 0.00–0.08)

## 2014-05-09 LAB — D-DIMER, QUANTITATIVE: D-Dimer, Quant: <0.27 ug/mL-FEU (ref 0.00–0.48)

## 2014-05-09 LAB — TSH: TSH: 4.67 u[IU]/mL — ABNORMAL HIGH (ref 0.350–4.500)

## 2014-05-09 MED ORDER — DOXYCYCLINE HYCLATE 100 MG PO CAPS: 100.0000 mg | ORAL_CAPSULE | Freq: Two times a day (BID) | ORAL | Status: AC

## 2014-05-09 NOTE — Discharge Instructions (Signed)
Call and make an appointment to followup with both your primary MD in with a cardiologist. Return immediately for fever, worsening chest pain, worsening headache, focal weakness, vision changes, shortness of breath or for any concerns. Chest Pain (Nonspecific) It is often hard to give a specific diagnosis for the cause of chest pain. There is always a chance that your pain could be related to something serious, such as a heart attack or a blood clot in the lungs. You need to follow up with your health care provider for further evaluation. CAUSES   Heartburn.  Pneumonia or bronchitis.  Anxiety or stress.  Inflammation around your heart (pericarditis) or lung (pleuritis or pleurisy).  A blood clot in the lung.  A collapsed lung (pneumothorax). It can develop suddenly on its own (spontaneous pneumothorax) or from trauma to the chest.  Shingles infection (herpes zoster virus). The chest wall is composed of bones, muscles, and cartilage. Any of these can be the source of the pain.  The bones can be bruised by injury.  The muscles or cartilage can be strained by coughing or overwork.  The cartilage can be affected by inflammation and become sore (costochondritis). DIAGNOSIS  Lab tests or other studies may be needed to find the cause of your pain. Your health care provider may have you take a test called an ambulatory electrocardiogram (ECG). An ECG records your heartbeat patterns over a 24-hour period. You may also have other tests, such as:  Transthoracic echocardiogram (TTE). During echocardiography, sound waves are used to evaluate how blood flows through your heart.  Transesophageal echocardiogram (TEE).  Cardiac monitoring. This allows your health care provider to monitor your heart rate and rhythm in real time.  Holter monitor. This is a portable device that records your heartbeat and can help diagnose heart arrhythmias. It allows your health care provider to track your heart  activity for several days, if needed.  Stress tests by exercise or by giving medicine that makes the heart beat faster. TREATMENT   Treatment depends on what may be causing your chest pain. Treatment may include:  Acid blockers for heartburn.  Anti-inflammatory medicine.  Pain medicine for inflammatory conditions.  Antibiotics if an infection is present.  You may be advised to change lifestyle habits. This includes stopping smoking and avoiding alcohol, caffeine, and chocolate.  You may be advised to keep your head raised (elevated) when sleeping. This reduces the chance of acid going backward from your stomach into your esophagus. Most of the time, nonspecific chest pain will improve within 2-3 days with rest and mild pain medicine.  HOME CARE INSTRUCTIONS   If antibiotics were prescribed, take them as directed. Finish them even if you start to feel better.  For the next few days, avoid physical activities that bring on chest pain. Continue physical activities as directed.  Do not use any tobacco products, including cigarettes, chewing tobacco, or electronic cigarettes.  Avoid drinking alcohol.  Only take medicine as directed by your health care provider.  Follow your health care provider's suggestions for further testing if your chest pain does not go away.  Keep any follow-up appointments you made. If you do not go to an appointment, you could develop lasting (chronic) problems with pain. If there is any problem keeping an appointment, call to reschedule. SEEK MEDICAL CARE IF:   Your chest pain does not go away, even after treatment.  You have a rash with blisters on your chest.  You have a fever. [place]  CARE IF:   You have increased chest pain or pain that spreads to your arm, neck, jaw, back, or abdomen.  You have shortness of breath.  You have an increasing cough, or you cough up blood.  You have severe back or abdominal pain.  You feel  nauseous or vomit.  You have severe weakness.  You faint.  You have chills. This is an emergency. Do not wait to see if the pain will go away. Get medical help at once. Call your local emergency services (911 in [place] Do not drive yourself to the hospital. MAKE SURE YOU:   Understand these instructions.  Will watch your condition.  Will get help right away if you are not doing well or get worse. Document Released: 06/28/2005 Document Revised: 09/23/2013 Document Reviewed: 04/23/2008 [facility] Patient Information 2015 [place], [place]. This information is not intended to replace advice given to you by your health care provider. Make sure you discuss any questions you have with your health care provider.

## 2014-05-09 NOTE — ED Notes (Signed)
Report given to Karen RN

## 2014-05-09 NOTE — ED Notes (Signed)
Pt came to the ED because he started have midsternal CP tonight. His blood pressure was also more elevated than normal, per pt. No SOB. On April 05, 2014, pt begin having left ear pain that seems to have become worse. He also is having neck pain and numbness. No respiratory distress. Will continue to monitor.

## 2014-05-09 NOTE — ED Notes (Signed)
Pt alert and oriented at discharge.  Pt escorted to the waiting room by this RN with steady, controlled gait.

## 2014-05-11 LAB — B. BURGDORFI ANTIBODIES: B burgdorferi Ab IgG+IgM: 0.39 {ISR}

## 2014-05-11 LAB — ROCKY MTN SPOTTED FVR AB, IGM-BLOOD: RMSF IgM: 0.26 IV (ref 0.00–0.89)

## 2014-05-11 LAB — ROCKY MTN SPOTTED FVR AB, IGG-BLOOD: RMSF IgG: 0.09 IV

## 2015-02-04 IMAGING — CR DG CHEST 1V PORT
1 series · 1 of 1 positions shown · non-contrast
Comparison: None.

CLINICAL DATA: Chest pain.  Shortness of breath.

EXAM:
PORTABLE CHEST - 1 VIEW

[AP]
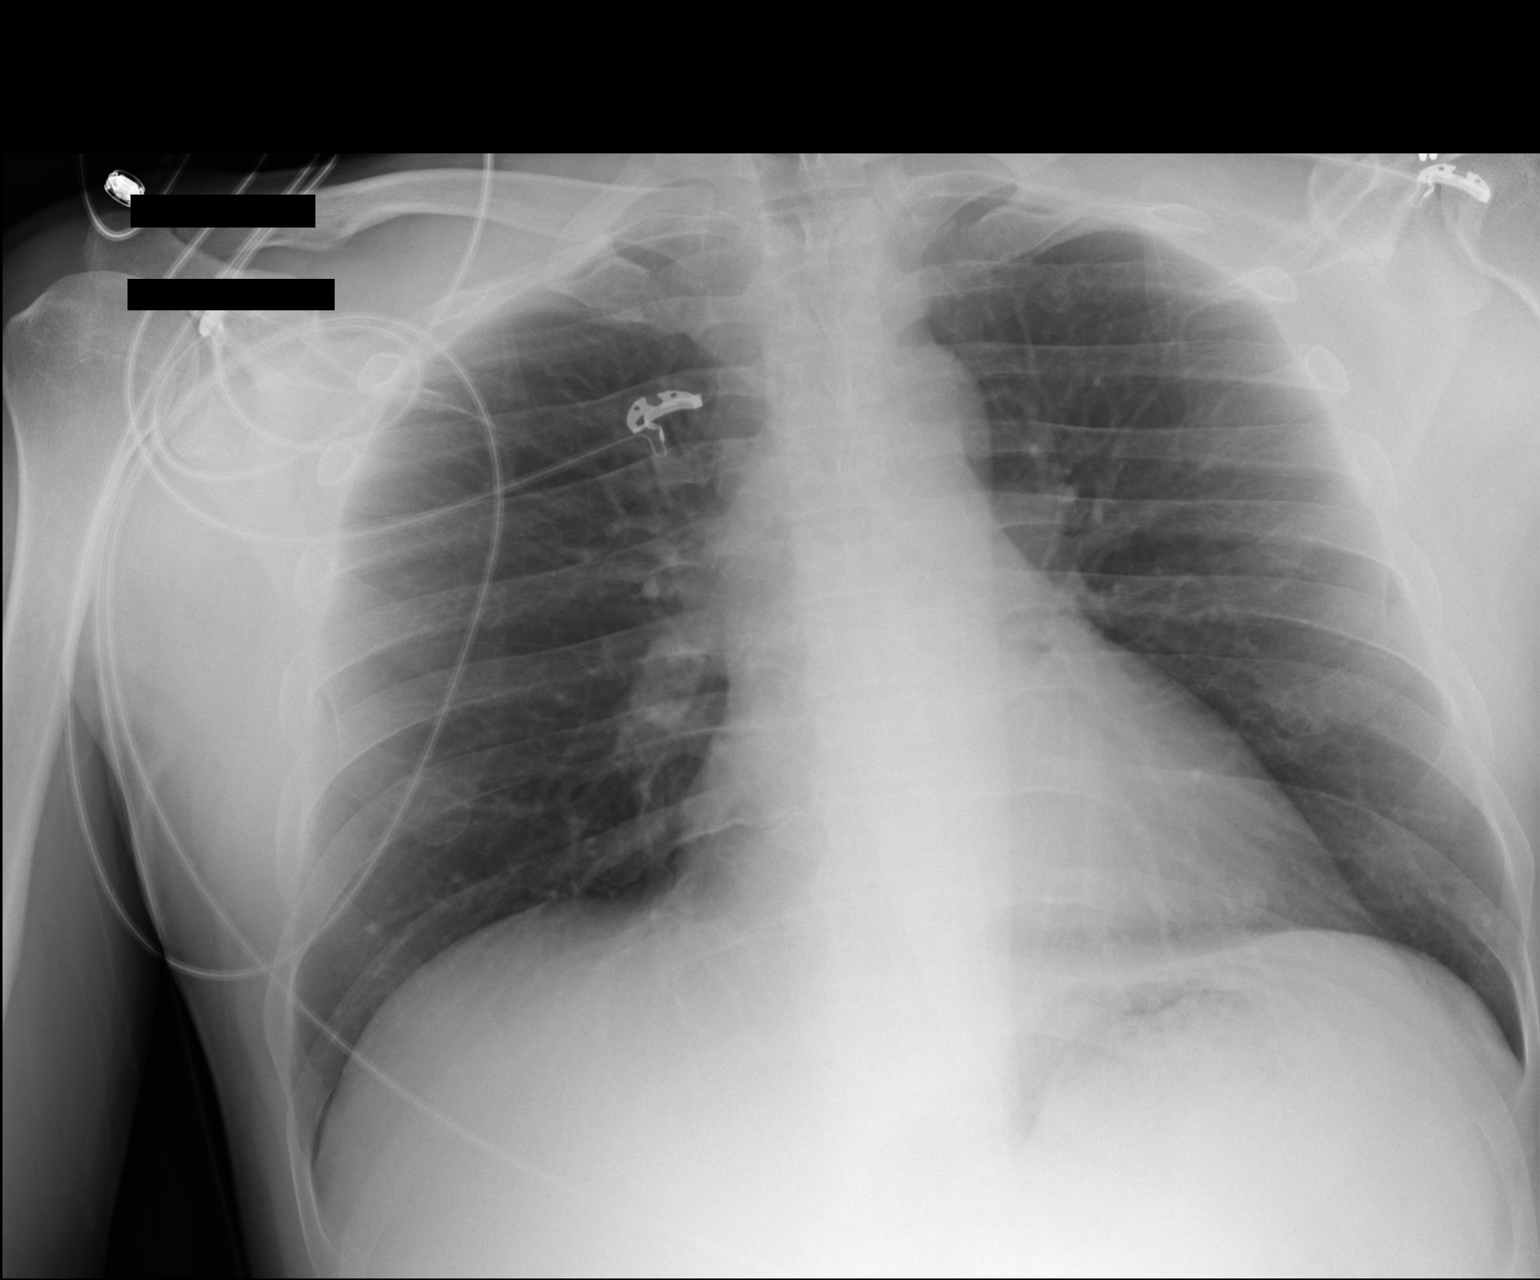

[1 of 1 positions shown; findings below may reference images not displayed]

FINDINGS: The lungs are well-aerated and clear. There is no evidence of focal
opacification, pleural effusion or pneumothorax.

The cardiomediastinal silhouette is within normal limits. No acute
osseous abnormalities are seen. Cervical spinal fusion hardware is
noted.
IMPRESSION: No acute cardiopulmonary process seen.
